# Patient Record
Sex: Male | Born: 1985 | Race: Black or African American | Hispanic: No | Marital: Single | Smoking: Never smoker
Health system: Southern US, Community
[De-identification: ages and names within clinical notes are randomized; demographics above are authoritative.]

---

## 2015-04-10 ENCOUNTER — Encounter (HOSPITAL_COMMUNITY): Payer: Self-pay | Admitting: Emergency Medicine

## 2015-04-10 ENCOUNTER — Emergency Department (HOSPITAL_COMMUNITY)
Admission: EM | Admit: 2015-04-10 | Discharge: 2015-04-10 | Disposition: A | Discharge status: Home / Self Care | Payer: Self-pay | Attending: Emergency Medicine | Admitting: Emergency Medicine

## 2015-04-10 DIAGNOSIS — X58XXXA Exposure to other specified factors, initial encounter: Secondary | ICD-10-CM | POA: Insufficient documentation

## 2015-04-10 DIAGNOSIS — Y9389 Activity, other specified: Secondary | ICD-10-CM | POA: Insufficient documentation

## 2015-04-10 DIAGNOSIS — Y990 Civilian activity done for income or pay: Secondary | ICD-10-CM | POA: Insufficient documentation

## 2015-04-10 DIAGNOSIS — Y9289 Other specified places as the place of occurrence of the external cause: Secondary | ICD-10-CM | POA: Insufficient documentation

## 2015-04-10 DIAGNOSIS — S39012A Strain of muscle, fascia and tendon of lower back, initial encounter: Secondary | ICD-10-CM | POA: Insufficient documentation

## 2015-04-10 MED ORDER — HYDROCODONE-ACETAMINOPHEN 5-325 MG PO TABS: 2.0000 | ORAL_TABLET | ORAL | Status: DC | PRN

## 2015-04-10 MED ORDER — NAPROXEN 375 MG PO TABS: 375.0000 mg | ORAL_TABLET | Freq: Two times a day (BID) | ORAL | Status: DC

## 2015-04-10 MED ORDER — LIDOCAINE 5 % EX PTCH: 1.0000 | MEDICATED_PATCH | CUTANEOUS | Status: DC

## 2015-04-10 NOTE — ED Notes (Signed)
Patient states he was picking up a wooden pallet at work yesterday and he felt like "something popped". Pain in lower medial back, bilateral thighs, and pelvis.

## 2015-04-10 NOTE — ED Provider Notes (Signed)
CSN: [id number]     Arrival date & time 04/10/15  1024 History  This chart was scribed for Arthor Captain, PA-C, working with Lyndal Pulley, MD by Elon Spanner, ED Scribe. This patient was seen in room TR07C/TR07C and the patient's care was started at 11:41 AM.   Chief Complaint  Patient presents with  . Back Pain   The history is provided by the patient. No language interpreter was used.    HPI Comments: Terry Hurst is a 29 y.o. male who presents to the Emergency Department complaining of sudden onset, constant, moderate lower back pain radiating to the buttocks and anterior bilateral thighs onset last night, not relieved by ibuprofen.  The patient reports the pain onset after lifting a heavy pallet at work.  Patient able to ambulate.  He denies radiation down the back of legs, numbness/tingling in feet/calves, saddle anesthesia, loss of bowel/bladder control.    History reviewed. No pertinent past medical history. History reviewed. No pertinent past surgical history. No family history on file. Social History  Substance Use Topics  . Smoking status: Never Smoker   . Smokeless tobacco: None  . Alcohol Use: Yes     Comment: occasionally    Review of Systems  Musculoskeletal: Positive for back pain.  All other systems reviewed and are negative.     Allergies  Sulfa antibiotics  Home Medications   Prior to Admission medications   Not on File   BP 125/67 mmHg  Pulse 69  Temp(Src) 98.6 F (37 C) (Oral)  Resp 20  Ht  (1.778 m)  Wt 212 lb (96.163 kg)  BMI 30.42 kg/m2  SpO2 100% Physical Exam  Constitutional: He is oriented to person, place, and time. He appears well-developed and well-nourished. No distress.  HENT:  Head: Normocephalic and atraumatic.  Eyes: Conjunctivae and EOM are normal.  Neck: Neck supple. No tracheal deviation present.  Cardiovascular: Normal rate.   Pulmonary/Chest: Effort normal. No respiratory distress.  Musculoskeletal: Normal range of  motion.  Tender right over sacral area.    Neurological: He is alert and oriented to person, place, and time.  Skin: Skin is warm and dry.  Psychiatric: He has a normal mood and affect. His behavior is normal.  Nursing note and vitals reviewed.   ED Course  Procedures (including critical care time)  DIAGNOSTIC STUDIES: Oxygen Saturation is 100% on RA, normal by my interpretation.    COORDINATION OF CARE:  11:47 AM Will prescribe muscle relaxant, pain medication, anti-inflammatory, and lido-derm patch.  Patient should ice complaint.  Patient acknowledges and agrees with plan.    Labs Review Labs Reviewed - No data to display  Imaging Review No results found. I have personally reviewed and evaluated these images and lab results as part of my medical decision-making.   EKG Interpretation None      MDM   Final diagnoses:  None    Patient with back pain.  No neurological deficits and normal neuro exam.  Patient can walk but states is painful.  No loss of bowel or bladder control.  No concern for cauda equina.  No fever, night sweats, weight loss, h/o cancer, IVDU.  RICE protocol and pain medicine indicated and discussed with patient.   I personally performed the services described in this documentation, which was scribed in my presence. The recorded information has been reviewed and is accurate.     Arthor Captain, PA-C 04/10/15 1201  Lyndal Pulley, MD 04/11/15 [phone]

## 2015-04-10 NOTE — Discharge Instructions (Signed)
SEEK IMMEDIATE MEDICAL ATTENTION IF: New numbness, tingling, weakness, or problem with the use of your arms or legs.  Severe back pain not relieved with medications.  Change in bowel or bladder control.  Increasing pain in any areas of the body (such as chest or abdominal pain).  Shortness of breath, dizziness or fainting.  Nausea (feeling sick to your stomach), vomiting, fever, or sweats.   Lumbosacral Strain Lumbosacral strain is a strain of any of the parts that make up your lumbosacral vertebrae. Your lumbosacral vertebrae are the bones that make up the lower third of your backbone. Your lumbosacral vertebrae are held together by muscles and tough, fibrous tissue (ligaments).  CAUSES  A sudden blow to your back can cause lumbosacral strain. Also, anything that causes an excessive stretch of the muscles in the low back can cause this strain. This is typically seen when people exert themselves strenuously, fall, lift heavy objects, bend, or crouch repeatedly. RISK FACTORS  Physically demanding work.  Participation in pushing or pulling sports or sports that require a sudden twist of the back (tennis, golf, baseball).  Weight lifting.  Excessive lower back curvature.  Forward-tilted pelvis.  Weak back or abdominal muscles or both.  Tight hamstrings. SIGNS AND SYMPTOMS  Lumbosacral strain may cause pain in the area of your injury or pain that moves (radiates) down your leg.  DIAGNOSIS Your health care provider can often diagnose lumbosacral strain through a physical exam. In some cases, you may need tests such as X-ray exams.  TREATMENT  Treatment for your lower back injury depends on many factors that your clinician will have to evaluate. However, most treatment will include the use of anti-inflammatory medicines. HOME CARE INSTRUCTIONS   Avoid hard physical activities (tennis, racquetball, waterskiing) if you are not in proper physical condition for it. This may aggravate or  create problems.  If you have a back problem, avoid sports requiring sudden body movements. Swimming and walking are generally safer activities.  Maintain good posture.  Maintain a healthy weight.  For acute conditions, you may put ice on the injured area.  Put ice in a plastic bag.  Place a towel between your skin and the bag.  Leave the ice on for 20 minutes, 2-3 times a day.  When the low back starts healing, stretching and strengthening exercises may be recommended. SEEK MEDICAL CARE IF:  Your back pain is getting worse.  You experience severe back pain not relieved with medicines. SEEK IMMEDIATE MEDICAL CARE IF:   You have numbness, tingling, weakness, or problems with the use of your arms or legs.  There is a change in bowel or bladder control.  You have increasing pain in any area of the body, including your belly (abdomen).  You notice shortness of breath, dizziness, or feel faint.  You feel sick to your stomach (nauseous), are throwing up (vomiting), or become sweaty.  You notice discoloration of your toes or legs, or your feet get very cold. MAKE SURE YOU:   Understand these instructions.  Will watch your condition.  Will get help right away if you are not doing well or get worse. Document Released: 04/14/2005 Document Revised: 07/10/2013 Document Reviewed: 02/21/2013 [facility] Patient Information 2015 [place], [place]. This information is not intended to replace advice given to you by your health care provider. Make sure you discuss any questions you have with your health care provider.  Low Back Strain with Rehab A strain is an injury in which a tendon or muscle  is torn. The muscles and tendons of the lower back are vulnerable to strains. However, these muscles and tendons are very strong and require a great force to be injured. Strains are classified into three categories. Grade 1 strains cause pain, but the tendon is not lengthened. Grade 2 strains include a  lengthened ligament, due to the ligament being stretched or partially ruptured. With grade 2 strains there is still function, although the function may be decreased. Grade 3 strains involve a complete tear of the tendon or muscle, and function is usually impaired. SYMPTOMS   Pain in the lower back.  Pain that affects one side more than the other.  Pain that gets worse with movement and may be felt in the hip, buttocks, or back of the thigh.  Muscle spasms of the muscles in the back.  Swelling along the muscles of the back.  Loss of strength of the back muscles.  Crackling sound (crepitation) when the muscles are touched. CAUSES  Lower back strains occur when a force is placed on the muscles or tendons that is greater than they can handle. Common causes of injury include:  Prolonged overuse of the muscle-tendon units in the lower back, usually from incorrect posture.  A single violent injury or force applied to the back. RISK INCREASES WITH:  Sports that involve twisting forces on the spine or a lot of bending at the waist (football, rugby, weightlifting, bowling, golf, tennis, speed skating, racquetball, swimming, running, gymnastics, diving).  Poor strength and flexibility.  Failure to warm up properly before activity.  Family history of lower back pain or disk disorders.  Previous back injury or surgery (especially fusion).  Poor posture with lifting, especially heavy objects.  Prolonged sitting, especially with poor posture. PREVENTION   Learn and use proper posture when sitting or lifting (maintain proper posture when sitting, lift using the knees and legs, not at the waist).  Warm up and stretch properly before activity.  Allow for adequate recovery between workouts.  Maintain physical fitness:  Strength, flexibility, and endurance.  Cardiovascular fitness. PROGNOSIS  If treated properly, lower back strains usually heal within 6 weeks. RELATED COMPLICATIONS     Recurring symptoms, resulting in a chronic problem.  Chronic inflammation, scarring, and partial muscle-tendon tear.  Delayed healing or resolution of symptoms.  Prolonged disability. TREATMENT  Treatment first involves the use of ice and medicine, to reduce pain and inflammation. The use of strengthening and stretching exercises may help reduce pain with activity. These exercises may be performed at home or with a therapist. Severe injuries may require referral to a therapist for further evaluation and treatment, such as ultrasound. Your caregiver may advise that you wear a back brace or corset, to help reduce pain and discomfort. Often, prolonged bed rest results in greater harm then benefit. Corticosteroid injections may be recommended. However, these should be reserved for the most serious cases. It is important to avoid using your back when lifting objects. At night, sleep on your back on a firm mattress with a pillow placed under your knees. If non-surgical treatment is unsuccessful, surgery may be needed.  MEDICATION   If pain medicine is needed, nonsteroidal anti-inflammatory medicines (aspirin and ibuprofen), or other minor pain relievers (acetaminophen), are often advised.  Do not take pain medicine for 7 days before surgery.  Prescription pain relievers may be given, if your caregiver thinks they are needed. Use only as directed and only as much as you need.  Ointments applied to the skin  may be helpful.  Corticosteroid injections may be given by your caregiver. These injections should be reserved for the most serious cases, because they may only be given a certain number of times. HEAT AND COLD  Cold treatment (icing) should be applied for 10 to 15 minutes every 2 to 3 hours for inflammation and pain, and immediately after activity that aggravates your symptoms. Use ice packs or an ice massage.  Heat treatment may be used before performing stretching and strengthening  activities prescribed by your caregiver, physical therapist, or athletic trainer. Use a heat pack or a warm water soak. SEEK MEDICAL CARE IF:   Symptoms get worse or do not improve in 2 to 4 weeks, despite treatment.  You develop numbness, weakness, or loss of bowel or bladder function.  New, unexplained symptoms develop. (Drugs used in treatment may produce side effects.) EXERCISES  RANGE OF MOTION (ROM) AND STRETCHING EXERCISES - Low Back Strain Most people with lower back pain will find that their symptoms get worse with excessive bending forward (flexion) or arching at the lower back (extension). The exercises which will help resolve your symptoms will focus on the opposite motion.  Your physician, physical therapist or athletic trainer will help you determine which exercises will be most helpful to resolve your lower back pain. Do not complete any exercises without first consulting with your caregiver. Discontinue any exercises which make your symptoms worse until you speak to your caregiver.  If you have pain, numbness or tingling which travels down into your buttocks, leg or foot, the goal of the therapy is for these symptoms to move closer to your back and eventually resolve. Sometimes, these leg symptoms will get better, but your lower back pain may worsen. This is typically an indication of progress in your rehabilitation. Be very alert to any changes in your symptoms and the activities in which you participated in the 24 hours prior to the change. Sharing this information with your caregiver will allow him/her to most efficiently treat your condition.  These exercises may help you when beginning to rehabilitate your injury. Your symptoms may resolve with or without further involvement from your physician, physical therapist or athletic trainer. While completing these exercises, remember:  Restoring tissue flexibility helps normal motion to return to the joints. This allows healthier, less  painful movement and activity.  An effective stretch should be held for at least 30 seconds.  A stretch should never be painful. You should only feel a gentle lengthening or release in the stretched tissue. FLEXION RANGE OF MOTION AND STRETCHING EXERCISES: STRETCH - Flexion, Single Knee to Chest   Lie on a firm bed or floor with both legs extended in front of you.  Keeping one leg in contact with the floor, bring your opposite knee to your chest. Hold your leg in place by either grabbing behind your thigh or at your knee.  Pull until you feel a gentle stretch in your lower back. Hold __________ seconds.  Slowly release your grasp and repeat the exercise with the opposite side. Repeat __________ times. Complete this exercise __________ times per day.  STRETCH - Flexion, Double Knee to Chest   Lie on a firm bed or floor with both legs extended in front of you.  Keeping one leg in contact with the floor, bring your opposite knee to your chest.  Tense your stomach muscles to support your back and then lift your other knee to your chest. Hold your legs in place by either  grabbing behind your thighs or at your knees.  Pull both knees toward your chest until you feel a gentle stretch in your lower back. Hold __________ seconds.  Tense your stomach muscles and slowly return one leg at a time to the floor. Repeat __________ times. Complete this exercise __________ times per day.  STRETCH - Low Trunk Rotation  Lie on a firm bed or floor. Keeping your legs in front of you, bend your knees so they are both pointed toward the ceiling and your feet are flat on the floor.  Extend your arms out to the side. This will stabilize your upper body by keeping your shoulders in contact with the floor.  Gently and slowly drop both knees together to one side until you feel a gentle stretch in your lower back. Hold for __________ seconds.  Tense your stomach muscles to support your lower back as you bring  your knees back to the starting position. Repeat the exercise to the other side. Repeat __________ times. Complete this exercise __________ times per day  EXTENSION RANGE OF MOTION AND FLEXIBILITY EXERCISES: STRETCH - Extension, Prone on Elbows   Lie on your stomach on the floor, a bed will be too soft. Place your palms about shoulder width apart and at the height of your head.  Place your elbows under your shoulders. If this is too painful, stack pillows under your chest.  Allow your body to relax so that your hips drop lower and make contact more completely with the floor.  Hold this position for __________ seconds.  Slowly return to lying flat on the floor. Repeat __________ times. Complete this exercise __________ times per day.  RANGE OF MOTION - Extension, Prone Press Ups  Lie on your stomach on the floor, a bed will be too soft. Place your palms about shoulder width apart and at the height of your head.  Keeping your back as relaxed as possible, slowly straighten your elbows while keeping your hips on the floor. You may adjust the placement of your hands to maximize your comfort. As you gain motion, your hands will come more underneath your shoulders.  Hold this position __________ seconds.  Slowly return to lying flat on the floor. Repeat __________ times. Complete this exercise __________ times per day.  RANGE OF MOTION- Quadruped, Neutral Spine   Assume a hands and knees position on a firm surface. Keep your hands under your shoulders and your knees under your hips. You may place padding under your knees for comfort.  Drop your head and point your tail bone toward the ground below you. This will round out your lower back like an angry cat. Hold this position for __________ seconds.  Slowly lift your head and release your tail bone so that your back sags into a large arch, like an old horse.  Hold this position for __________ seconds.  Repeat this until you feel limber in  your lower back.  Now, find your "sweet spot." This will be the most comfortable position somewhere between the two previous positions. This is your neutral spine. Once you have found this position, tense your stomach muscles to support your lower back.  Hold this position for __________ seconds. Repeat __________ times. Complete this exercise __________ times per day.  STRENGTHENING EXERCISES - Low Back Strain These exercises may help you when beginning to rehabilitate your injury. These exercises should be done near your "sweet spot." This is the neutral, low-back arch, somewhere between fully rounded and fully arched,  that is your least painful position. When performed in this safe range of motion, these exercises can be used for people who have either a flexion or extension based injury. These exercises may resolve your symptoms with or without further involvement from your physician, physical therapist or athletic trainer. While completing these exercises, remember:   Muscles can gain both the endurance and the strength needed for everyday activities through controlled exercises.  Complete these exercises as instructed by your physician, physical therapist or athletic trainer. Increase the resistance and repetitions only as guided.  You may experience muscle soreness or fatigue, but the pain or discomfort you are trying to eliminate should never worsen during these exercises. If this pain does worsen, stop and make certain you are following the directions exactly. If the pain is still present after adjustments, discontinue the exercise until you can discuss the trouble with your caregiver. STRENGTHENING - Deep Abdominals, Pelvic Tilt  Lie on a firm bed or floor. Keeping your legs in front of you, bend your knees so they are both pointed toward the ceiling and your feet are flat on the floor.  Tense your lower abdominal muscles to press your lower back into the floor. This motion will rotate  your pelvis so that your tail bone is scooping upwards rather than pointing at your feet or into the floor.  With a gentle tension and even breathing, hold this position for __________ seconds. Repeat __________ times. Complete this exercise __________ times per day.  STRENGTHENING - Abdominals, Crunches   Lie on a firm bed or floor. Keeping your legs in front of you, bend your knees so they are both pointed toward the ceiling and your feet are flat on the floor. Cross your arms over your chest.  Slightly tip your chin down without bending your neck.  Tense your abdominals and slowly lift your trunk high enough to just clear your shoulder blades. Lifting higher can put excessive stress on the lower back and does not further strengthen your abdominal muscles.  Control your return to the starting position. Repeat __________ times. Complete this exercise __________ times per day.  STRENGTHENING - Quadruped, Opposite UE/LE Lift   Assume a hands and knees position on a firm surface. Keep your hands under your shoulders and your knees under your hips. You may place padding under your knees for comfort.  Find your neutral spine and gently tense your abdominal muscles so that you can maintain this position. Your shoulders and hips should form a rectangle that is parallel with the floor and is not twisted.  Keeping your trunk steady, lift your right hand no higher than your shoulder and then your left leg no higher than your hip. Make sure you are not holding your breath. Hold this position __________ seconds.  Continuing to keep your abdominal muscles tense and your back steady, slowly return to your starting position. Repeat with the opposite arm and leg. Repeat __________ times. Complete this exercise __________ times per day.  STRENGTHENING - Lower Abdominals, Double Knee Lift  Lie on a firm bed or floor. Keeping your legs in front of you, bend your knees so they are both pointed toward the  ceiling and your feet are flat on the floor.  Tense your abdominal muscles to brace your lower back and slowly lift both of your knees until they come over your hips. Be certain not to hold your breath.  Hold __________ seconds. Using your abdominal muscles, return to the starting position in  a slow and controlled manner. Repeat __________ times. Complete this exercise __________ times per day.  POSTURE AND BODY MECHANICS CONSIDERATIONS - Low Back Strain Keeping correct posture when sitting, standing or completing your activities will reduce the stress put on different body tissues, allowing injured tissues a chance to heal and limiting painful experiences. The following are general guidelines for improved posture. Your physician or physical therapist will provide you with any instructions specific to your needs. While reading these guidelines, remember:  The exercises prescribed by your provider will help you have the flexibility and strength to maintain correct postures.  The correct posture provides the best environment for your joints to work. All of your joints have less wear and tear when properly supported by a spine with good posture. This means you will experience a healthier, less painful body.  Correct posture must be practiced with all of your activities, especially prolonged sitting and standing. Correct posture is as important when doing repetitive low-stress activities (typing) as it is when doing a single heavy-load activity (lifting). RESTING POSITIONS Consider which positions are most painful for you when choosing a resting position. If you have pain with flexion-based activities (sitting, bending, stooping, squatting), choose a position that allows you to rest in a less flexed posture. You would want to avoid curling into a fetal position on your side. If your pain worsens with extension-based activities (prolonged standing, working overhead), avoid resting in an extended position  such as sleeping on your stomach. Most people will find more comfort when they rest with their spine in a more neutral position, neither too rounded nor too arched. Lying on a non-sagging bed on your side with a pillow between your knees, or on your back with a pillow under your knees will often provide some relief. Keep in mind, being in any one position for a prolonged period of time, no matter how correct your posture, can still lead to stiffness. PROPER SITTING POSTURE In order to minimize stress and discomfort on your spine, you must sit with correct posture. Sitting with good posture should be effortless for a healthy body. Returning to good posture is a gradual process. Many people can work toward this most comfortably by using various supports until they have the flexibility and strength to maintain this posture on their own. When sitting with proper posture, your ears will fall over your shoulders and your shoulders will fall over your hips. You should use the back of the chair to support your upper back. Your lower back will be in a neutral position, just slightly arched. You may place a small pillow or folded towel at the base of your lower back for support.  When working at a desk, create an environment that supports good, upright posture. Without extra support, muscles tire, which leads to excessive strain on joints and other tissues. Keep these recommendations in mind: CHAIR:  A chair should be able to slide under your desk when your back makes contact with the back of the chair. This allows you to work closely.  The chair's height should allow your eyes to be level with the upper part of your monitor and your hands to be slightly lower than your elbows. BODY POSITION  Your feet should make contact with the floor. If this is not possible, use a foot rest.  Keep your ears over your shoulders. This will reduce stress on your neck and lower back. INCORRECT SITTING POSTURES  If you are  feeling tired and unable  to assume a healthy sitting posture, do not slouch or slump. This puts excessive strain on your back tissues, causing more damage and pain. Healthier options include:  Using more support, like a lumbar pillow.  Switching tasks to something that requires you to be upright or walking.  Talking a brief walk.  Lying down to rest in a neutral-spine position. PROLONGED STANDING WHILE SLIGHTLY LEANING FORWARD  When completing a task that requires you to lean forward while standing in one place for a long time, place either foot up on a stationary 2-4 inch high object to help maintain the best posture. When both feet are on the ground, the lower back tends to lose its slight inward curve. If this curve flattens (or becomes too large), then the back and your other joints will experience too much stress, tire more quickly, and can cause pain. CORRECT STANDING POSTURES Proper standing posture should be assumed with all daily activities, even if they only take a few moments, like when brushing your teeth. As in sitting, your ears should fall over your shoulders and your shoulders should fall over your hips. You should keep a slight tension in your abdominal muscles to brace your spine. Your tailbone should point down to the ground, not behind your body, resulting in an over-extended swayback posture.  INCORRECT STANDING POSTURES  Common incorrect standing postures include a forward head, locked knees and/or an excessive swayback. WALKING Walk with an upright posture. Your ears, shoulders and hips should all line-up. PROLONGED ACTIVITY IN A FLEXED POSITION When completing a task that requires you to bend forward at your waist or lean over a low surface, try to find a way to stabilize 3 out of 4 of your limbs. You can place a hand or elbow on your thigh or rest a knee on the surface you are reaching across. This will provide you more stability so that your muscles do not fatigue as  quickly. By keeping your knees relaxed, or slightly bent, you will also reduce stress across your lower back. CORRECT LIFTING TECHNIQUES DO :   Assume a wide stance. This will provide you more stability and the opportunity to get as close as possible to the object which you are lifting.  Tense your abdominals to brace your spine. Bend at the knees and hips. Keeping your back locked in a neutral-spine position, lift using your leg muscles. Lift with your legs, keeping your back straight.  Test the weight of unknown objects before attempting to lift them.  Try to keep your elbows locked down at your sides in order get the best strength from your shoulders when carrying an object.  Always ask for help when lifting heavy or awkward objects. INCORRECT LIFTING TECHNIQUES DO NOT:   Lock your knees when lifting, even if it is a small object.  Bend and twist. Pivot at your feet or move your feet when needing to change directions.  Assume that you can safely pick up even a paper clip without proper posture. Document Released: 07/05/2005 Document Revised: 09/27/2011 Document Reviewed: 10/17/2008 [facility] Patient Information 2015 [place], [place]. This information is not intended to replace advice given to you by your health care provider. Make sure you discuss any questions you have with your health care provider.

## 2015-04-10 NOTE — ED Notes (Signed)
Pt c/o LBP since lifting a wooden pallet at work last evening. States pain radiates to his buttocks, pelvis and legs.

## 2016-08-26 ENCOUNTER — Emergency Department (HOSPITAL_COMMUNITY)
Admission: EM | Admit: 2016-08-26 | Discharge: 2016-08-26 | Disposition: A | Discharge status: Home / Self Care | Payer: Self-pay | Attending: Dermatology | Admitting: Dermatology

## 2016-08-26 ENCOUNTER — Encounter (HOSPITAL_COMMUNITY): Payer: Self-pay

## 2016-08-26 DIAGNOSIS — Z5321 Procedure and treatment not carried out due to patient leaving prior to being seen by health care provider: Secondary | ICD-10-CM | POA: Insufficient documentation

## 2016-08-26 DIAGNOSIS — R05 Cough: Secondary | ICD-10-CM | POA: Insufficient documentation

## 2016-08-26 NOTE — ED Notes (Signed)
Pt made aware that he had fever.  Speaking to triage nurse about meds

## 2016-08-26 NOTE — ED Triage Notes (Signed)
Pt complaining of cough and congestion x 1 day. Pt states fever/chills. Pt also requesting STD testing.

## 2016-08-26 NOTE — ED Notes (Signed)
Stickers provided by registration.  States the patient left.  Did not speak to RN.

## 2016-08-28 ENCOUNTER — Emergency Department (HOSPITAL_COMMUNITY)
Admission: EM | Admit: 2016-08-28 | Discharge: 2016-08-28 | Disposition: A | Discharge status: Home / Self Care | Payer: Managed Care, Other (non HMO) | Attending: Emergency Medicine | Admitting: Emergency Medicine

## 2016-08-28 ENCOUNTER — Encounter (HOSPITAL_COMMUNITY): Payer: Self-pay

## 2016-08-28 DIAGNOSIS — Z202 Contact with and (suspected) exposure to infections with a predominantly sexual mode of transmission: Secondary | ICD-10-CM | POA: Diagnosis present

## 2016-08-28 DIAGNOSIS — Z711 Person with feared health complaint in whom no diagnosis is made: Secondary | ICD-10-CM

## 2016-08-28 LAB — RAPID HIV SCREEN (HIV 1/2 AB+AG)
HIV 1/2 Antibodies: NONREACTIVE
HIV-1 P24 Antigen - HIV24: NONREACTIVE

## 2016-08-28 NOTE — ED Triage Notes (Signed)
PT denies any contact with partners who are positive for a STD. Pt denies any symptoms . Pt reports he just wants a well STD check up.

## 2016-08-28 NOTE — ED Provider Notes (Signed)
[facility]-EMERGENCY DEPT Provider Note   CSN: [id number] Arrival date & time: 08/28/16  [id number]  By signing my name below, I, Terry Hurst, attest that this documentation has been prepared under the direction and in the presence of Fayrene HelperBowie Xiadani Damman, PA-C. Electronically Signed: Orpah CobbMaurice Hurst , ED Scribe. 08/28/16. 11:27 AM.    History   Chief Complaint No chief complaint on file.   HPI  Terry Hurst is a 31 y.o. male with no hx of STD who presents to the Emergency Department complaining of a male GU problem with onset x2 month. Pt suspects that he has a bacterial infection due to discoloration of his pubic hairs x2 months ago. He states that he has tried trimming the pubic hair but it grows back with the same presentation. Pt denies testicular pain, itchiness, dysuria, hematuria, penile discharge. Of note, pt reports having unprotected sex with one partner in the past year.   The history is provided by the patient. No language interpreter was used.    History reviewed. No pertinent past medical history.  There are no active problems to display for this patient.   History reviewed. No pertinent surgical history.     Home Medications    Prior to Admission medications   Medication Sig Start Date End Date Taking? Authorizing Provider  HYDROcodone-acetaminophen (NORCO) 5-325 MG per tablet Take 2 tablets by mouth every 4 (four) hours as needed. Patient not taking: Reported on 08/28/2016 04/10/15   Arthor CaptainAbigail Harris, PA-C  lidocaine (LIDODERM) 5 % Place 1 patch onto the skin daily. Remove & Discard patch within 12 hours or as directed by MD Patient not taking: Reported on 08/28/2016 04/10/15   Arthor CaptainAbigail Harris, PA-C  naproxen (NAPROSYN) 375 MG tablet Take 1 tablet (375 mg total) by mouth 2 (two) times daily. Patient not taking: Reported on 08/28/2016 04/10/15   Arthor CaptainAbigail Harris, PA-C    Family History No family history on file.  Social History Social History  Substance Use Topics  . Smoking  status: Never Smoker  . Smokeless tobacco: Never Used  . Alcohol use Yes     Comment: occasionally     Allergies   Sulfa antibiotics   Review of Systems Review of Systems  Constitutional: Negative for fever.  Respiratory: Negative for shortness of breath.   Cardiovascular: Negative for chest pain.  Genitourinary: Negative for discharge, dysuria, penile pain and testicular pain.     Physical Exam Updated Vital Signs BP 120/80   Pulse 78   Temp 98.8 F (37.1 C) (Oral)   Resp 18   SpO2 100%   Physical Exam  Constitutional: He appears well-developed and well-nourished.  HENT:  Head: Normocephalic and atraumatic.  Eyes: Conjunctivae are normal.  Neck: Neck supple.  Cardiovascular: Normal rate and regular rhythm.   No murmur heard. Pulmonary/Chest: Effort normal and breath sounds normal. No respiratory distress.  Abdominal: Soft. There is no tenderness.  Genitourinary: Testes normal. Right testis shows no tenderness. Left testis shows no tenderness. Circumcised. No penile tenderness.  Genitourinary Comments: Chaperone present: Terry Hurst Circumcised penis free of lesional rash, pubic hair on the R side has whitish coating on the hair. Testes with normal lie.   Musculoskeletal: He exhibits no edema.  Neurological: He is alert.  Skin: Skin is warm and dry.  Psychiatric: He has a normal mood and affect.  Nursing note and vitals reviewed.    ED Treatments / Results   DIAGNOSTIC STUDIES: Oxygen Saturation is 100% on RA, normal by my interpretation.  COORDINATION OF CARE: 11:28 AM-Discussed next steps with pt. Pt verbalized understanding and is agreeable with the plan.    Labs (all labs ordered are listed, but only abnormal results are displayed) Labs Reviewed - No data to display  EKG  EKG Interpretation None       Radiology No results found.  Procedures Procedures (including critical care time)  Medications Ordered in ED Medications - No  data to display   Initial Impression / Assessment and Plan / ED Course  I have reviewed the triage vital signs and the nursing notes.  Pertinent labs & imaging results that were available during my care of the patient were reviewed by me and considered in my medical decision making (see chart for details).     BP 120/80   Pulse 78   Temp 98.8 F (37.1 C) (Oral)   Resp 18   SpO2 100%    Final Clinical Impressions(s) / ED Diagnoses   Final diagnoses:  Concern about STD in male without diagnosis    New Prescriptions New Prescriptions   No medications on file   I personally performed the services described in this documentation, which was scribed in my presence. The recorded information has been reviewed and is accurate.   12:32 PM Pt here with concern for STD.  sts he noticed discoloration to his pubic hair for the past 2 months.  On exam, it appears he has some off white coating overlying some of his pubic hair, which can be rubbed off.  Unsure if it's from excessive sweating which trapped dead skin cells vs discharge. It doesn't appear that the hair has lost any pigmentation.  STD screen provided.  Recommend f/u with pcp.  He also have significant gynecomastia, has had it for most of his life.  Not on any long term medication that may have cause it.  He should f/u with pcp for this.     Fayrene Helper, PA-C 08/28/16 1234    Mancel Bale, MD 08/28/16 [phone]

## 2016-08-28 NOTE — ED Triage Notes (Signed)
Patient here with complaint of discoloration to pubic hair for a few months and had cramping in left calf yesterday that has resolved

## 2016-08-28 NOTE — ED Notes (Signed)
Declined W/C at D/C and was escorted to lobby by RN. 

## 2016-08-28 NOTE — Discharge Instructions (Signed)
You have been screen for potential sexually transmitted disease.  You will be notified in the next few days if tested positive.  You also have evidence of gynecomastia, a condition of enlarge breast tissue in male.  Please find a primary care provider for further evaluation and management.

## 2016-08-29 LAB — RPR: RPR: NONREACTIVE

## 2016-08-30 LAB — GC/CHLAMYDIA PROBE AMP (~~LOC~~) NOT AT ARMC
Chlamydia: NEGATIVE
NEISSERIA GONORRHEA: NEGATIVE

## 2017-05-23 ENCOUNTER — Encounter (HOSPITAL_COMMUNITY): Payer: Self-pay | Admitting: Emergency Medicine

## 2017-05-23 ENCOUNTER — Other Ambulatory Visit: Payer: Self-pay

## 2017-05-23 ENCOUNTER — Emergency Department (HOSPITAL_COMMUNITY)
Admission: EM | Admit: 2017-05-23 | Discharge: 2017-05-23 | Disposition: A | Discharge status: Home / Self Care | Payer: Managed Care, Other (non HMO) | Attending: Emergency Medicine | Admitting: Emergency Medicine

## 2017-05-23 DIAGNOSIS — S161XXA Strain of muscle, fascia and tendon at neck level, initial encounter: Secondary | ICD-10-CM | POA: Insufficient documentation

## 2017-05-23 DIAGNOSIS — Y92410 Unspecified street and highway as the place of occurrence of the external cause: Secondary | ICD-10-CM | POA: Diagnosis not present

## 2017-05-23 DIAGNOSIS — M7918 Myalgia, other site: Secondary | ICD-10-CM

## 2017-05-23 DIAGNOSIS — Y9389 Activity, other specified: Secondary | ICD-10-CM | POA: Insufficient documentation

## 2017-05-23 DIAGNOSIS — Y999 Unspecified external cause status: Secondary | ICD-10-CM | POA: Insufficient documentation

## 2017-05-23 DIAGNOSIS — S199XXA Unspecified injury of neck, initial encounter: Secondary | ICD-10-CM | POA: Diagnosis present

## 2017-05-23 MED ORDER — IBUPROFEN 200 MG PO TABS
600.0000 mg | ORAL_TABLET | Freq: Once | ORAL | Status: AC
  Administered 2017-05-23: 600 mg via ORAL
  Filled 2017-05-23: qty 1

## 2017-05-23 MED ORDER — IBUPROFEN 600 MG PO TABS: 600.0000 mg | ORAL_TABLET | Freq: Four times a day (QID) | ORAL | 0 refills | Status: DC | PRN

## 2017-05-23 MED ORDER — METHOCARBAMOL 500 MG PO TABS: 500.0000 mg | ORAL_TABLET | Freq: Two times a day (BID) | ORAL | 0 refills | Status: DC
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
## 2017-05-25 ENCOUNTER — Encounter (HOSPITAL_COMMUNITY): Payer: Self-pay | Admitting: Emergency Medicine

## 2017-05-25 ENCOUNTER — Emergency Department (HOSPITAL_COMMUNITY)
Admission: EM | Admit: 2017-05-25 | Discharge: 2017-05-25 | Disposition: A | Discharge status: Home / Self Care | Payer: Managed Care, Other (non HMO) | Attending: Emergency Medicine | Admitting: Emergency Medicine

## 2017-05-25 ENCOUNTER — Emergency Department (HOSPITAL_COMMUNITY): Payer: Managed Care, Other (non HMO)

## 2017-05-25 ENCOUNTER — Other Ambulatory Visit: Payer: Self-pay

## 2017-05-25 DIAGNOSIS — Y939 Activity, unspecified: Secondary | ICD-10-CM | POA: Insufficient documentation

## 2017-05-25 DIAGNOSIS — M549 Dorsalgia, unspecified: Secondary | ICD-10-CM

## 2017-05-25 DIAGNOSIS — Y999 Unspecified external cause status: Secondary | ICD-10-CM | POA: Diagnosis not present

## 2017-05-25 DIAGNOSIS — Y92410 Unspecified street and highway as the place of occurrence of the external cause: Secondary | ICD-10-CM | POA: Insufficient documentation

## 2017-05-25 DIAGNOSIS — T148XXA Other injury of unspecified body region, initial encounter: Secondary | ICD-10-CM

## 2017-05-25 DIAGNOSIS — S199XXA Unspecified injury of neck, initial encounter: Secondary | ICD-10-CM | POA: Diagnosis present

## 2017-05-25 DIAGNOSIS — S161XXA Strain of muscle, fascia and tendon at neck level, initial encounter: Secondary | ICD-10-CM | POA: Insufficient documentation
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
## 2018-01-19 ENCOUNTER — Encounter (HOSPITAL_COMMUNITY): Payer: Self-pay | Admitting: Emergency Medicine

## 2018-01-19 ENCOUNTER — Emergency Department (HOSPITAL_COMMUNITY)
Admission: EM | Admit: 2018-01-19 | Discharge: 2018-01-19 | Disposition: A | Discharge status: Home / Self Care | Payer: Managed Care, Other (non HMO) | Attending: Emergency Medicine | Admitting: Emergency Medicine

## 2018-01-19 ENCOUNTER — Emergency Department (HOSPITAL_COMMUNITY): Payer: Managed Care, Other (non HMO)

## 2018-01-19 DIAGNOSIS — Y9389 Activity, other specified: Secondary | ICD-10-CM | POA: Diagnosis not present

## 2018-01-19 DIAGNOSIS — S01111A Laceration without foreign body of right eyelid and periocular area, initial encounter: Secondary | ICD-10-CM | POA: Diagnosis not present

## 2018-01-19 DIAGNOSIS — S0990XA Unspecified injury of head, initial encounter: Secondary | ICD-10-CM | POA: Diagnosis present

## 2018-01-19 DIAGNOSIS — Y999 Unspecified external cause status: Secondary | ICD-10-CM | POA: Insufficient documentation

## 2018-01-19 DIAGNOSIS — E860 Dehydration: Secondary | ICD-10-CM | POA: Diagnosis not present

## 2018-01-19 DIAGNOSIS — W0110XA Fall on same level from slipping, tripping and stumbling with subsequent striking against unspecified object, initial encounter: Secondary | ICD-10-CM | POA: Diagnosis not present

## 2018-01-19 DIAGNOSIS — Y929 Unspecified place or not applicable: Secondary | ICD-10-CM | POA: Diagnosis not present

## 2018-01-19 DIAGNOSIS — R55 Syncope and collapse: Secondary | ICD-10-CM | POA: Insufficient documentation

## 2018-01-19 LAB — CBC WITH DIFFERENTIAL/PLATELET
Abs Immature Granulocytes: 0.1 10*3/uL (ref 0.0–0.1)
BASOS ABS: 0 10*3/uL (ref 0.0–0.1)
BASOS PCT: 0 %
EOS PCT: 0 %
Eosinophils Absolute: 0 10*3/uL (ref 0.0–0.7)
HCT: 45.2 % (ref 39.0–52.0)
Hemoglobin: 14.1 g/dL (ref 13.0–17.0)
IMMATURE GRANULOCYTES: 0 %
LYMPHS PCT: 10 %
Lymphs Abs: 1.2 10*3/uL (ref 0.7–4.0)
MCH: 27.4 pg (ref 26.0–34.0)
MCHC: 31.2 g/dL (ref 30.0–36.0)
MCV: 87.9 fL (ref 78.0–100.0)
Monocytes Absolute: 0.7 10*3/uL (ref 0.1–1.0)
Monocytes Relative: 6 %
NEUTROS ABS: 9.9 10*3/uL — AB (ref 1.7–7.7)
Neutrophils Relative %: 84 %
PLATELETS: 202 10*3/uL (ref 150–400)
RBC: 5.14 MIL/uL (ref 4.22–5.81)
RDW: 13.6 % (ref 11.5–15.5)
WBC: 11.9 10*3/uL — AB (ref 4.0–10.5)

## 2018-01-19 LAB — COMPREHENSIVE METABOLIC PANEL
ALT: 10 U/L (ref 0–44)
ANION GAP: 9 (ref 5–15)
AST: 18 U/L (ref 15–41)
Albumin: 3.2 g/dL — ABNORMAL LOW (ref 3.5–5.0)
Alkaline Phosphatase: 30 U/L — ABNORMAL LOW (ref 38–126)
BILIRUBIN TOTAL: 0.6 mg/dL (ref 0.3–1.2)
BUN: 13 mg/dL (ref 6–20)
CHLORIDE: 108 mmol/L (ref 98–111)
CO2: 22 mmol/L (ref 22–32)
Calcium: 8.3 mg/dL — ABNORMAL LOW (ref 8.9–10.3)
Creatinine, Ser: 1 mg/dL (ref 0.61–1.24)
GFR calc non Af Amer: >60 mL/min (ref >60)
Glucose, Bld: 79 mg/dL (ref 70–99)
POTASSIUM: 3.9 mmol/L (ref 3.5–5.1)
Sodium: 139 mmol/L (ref 135–145)
TOTAL PROTEIN: 5.2 g/dL — AB (ref 6.5–8.1)

## 2018-01-19 LAB — URINALYSIS, ROUTINE W REFLEX MICROSCOPIC
Bilirubin Urine: NEGATIVE
GLUCOSE, UA: NEGATIVE mg/dL
Hgb urine dipstick: NEGATIVE
KETONES UR: 20 mg/dL — AB
LEUKOCYTES UA: NEGATIVE
NITRITE: NEGATIVE
PROTEIN: NEGATIVE mg/dL
Specific Gravity, Urine: 1.017 (ref 1.005–1.030)
pH: 6 (ref 5.0–8.0)

## 2018-01-19 LAB — ETHANOL: Alcohol, Ethyl (B): 19 mg/dL — ABNORMAL HIGH (ref <10)

## 2018-01-19 MED ORDER — LIDOCAINE-EPINEPHRINE (PF) 2 %-1:200000 IJ SOLN
10.0000 mL | Freq: Once | INTRAMUSCULAR | Status: AC
  Administered 2018-01-19: 10 mL
  Filled 2018-01-19: qty 20

## 2018-01-19 MED ORDER — TETANUS-DIPHTH-ACELL PERTUSSIS 5-2.5-18.5 LF-MCG/0.5 IM SUSP
0.5000 mL | Freq: Once | INTRAMUSCULAR | Status: AC
  Administered 2018-01-19: 0.5 mL via INTRAMUSCULAR
  Filled 2018-01-19: qty 0.5

## 2018-01-19 MED ORDER — SODIUM CHLORIDE 0.9 % IV BOLUS
1000.0000 mL | Freq: Once | INTRAVENOUS | Status: AC
  Administered 2018-01-19: 1000 mL via INTRAVENOUS

## 2018-01-19 NOTE — ED Notes (Signed)
Pt informed to provide UA when possible. Pt given urinal / verbalized understanding.

## 2018-01-19 NOTE — ED Notes (Signed)
Patient verbalizes understanding of discharge instructions. Opportunity for questioning and answers were provided. Armband removed by staff, pt discharged from ED in wheelchair.  

## 2018-01-19 NOTE — ED Notes (Signed)
Pt c.o pain at Iv site, no infiltration or phlebitis noted, family member at bedside requesting this rn remove the Iv, this rn asked the pt if it was hurting, pt did not respond due to family member talking over pt. Iv removed and another Iv to be established

## 2018-01-19 NOTE — ED Notes (Signed)
Informed pt we still need urine sample. Urinal at bedside

## 2018-01-19 NOTE — ED Triage Notes (Signed)
Pt here after a syncopal episode after drinking some etoh today and giving plasma , pos loc , small lac to the forehead

## 2018-01-19 NOTE — ED Provider Notes (Signed)
[facility] EMERGENCY DEPARTMENT Provider Note   CSN: [id number] Arrival date & time: 01/19/18  1811     History   Chief Complaint Chief Complaint  Patient presents with  . Loss of Consciousness    HPI Terry Hurst is a 32 y.o. male.  HPI Patient states he gave plasma this morning.  He is been standing outside drinking beer grilling most of the day.  Got up from standing and had a brief syncopal episode falling back and striking the back of his head.  This was witnessed and patient was only syncopal for a brief moment.  When patient came to he immediately jumped up and attempted to walk inside.  This point he collapsed again striking the corner of a door.  Had very brief loss of consciousness.  No seizure-like activity.  Sustained laceration to the right eyebrow.  Bleeding is controlled currently.  Patient states he just feels generally weak.  Complains of headache.  No neck pain.  No focal weakness or numbness. History reviewed. No pertinent past medical history.  There are no active problems to display for this patient.   History reviewed. No pertinent surgical history.      Home Medications    Prior to Admission medications   Medication Sig Start Date End Date Taking? Authorizing Provider  HYDROcodone-acetaminophen (NORCO) 5-325 MG per tablet Take 2 tablets by mouth every 4 (four) hours as needed. Patient not taking: Reported on 08/28/2016 04/10/15   Arthor Captain, PA-C  ibuprofen (ADVIL,MOTRIN) 600 MG tablet Take 1 tablet (600 mg total) every 6 (six) hours as needed by mouth. Patient not taking: Reported on 01/19/2018 05/23/17   Dartha Lodge, PA-C  lidocaine (LIDODERM) 5 % Place 1 patch onto the skin daily. Remove & Discard patch within 12 hours or as directed by MD Patient not taking: Reported on 08/28/2016 04/10/15   Arthor Captain, PA-C  methocarbamol (ROBAXIN) 500 MG tablet Take 1 tablet (500 mg total) 2 (two) times daily by mouth. Patient not taking:  Reported on 01/19/2018 05/23/17   Dartha Lodge, PA-C  naproxen (NAPROSYN) 375 MG tablet Take 1 tablet (375 mg total) by mouth 2 (two) times daily. Patient not taking: Reported on 08/28/2016 04/10/15   Arthor Captain, PA-C    Family History History reviewed. No pertinent family history.  Social History Social History   Tobacco Use  . Smoking status: Never Smoker  . Smokeless tobacco: Never Used  Substance Use Topics  . Alcohol use: Yes    Comment: occasionally  . Drug use: No     Allergies   Sulfa antibiotics   Review of Systems Review of Systems  Constitutional: Positive for fatigue. Negative for chills and fever.  Respiratory: Negative for shortness of breath.   Cardiovascular: Negative for chest pain.  Gastrointestinal: Negative for abdominal pain, diarrhea, nausea and vomiting.  Musculoskeletal: Negative for back pain and neck pain.  Skin: Positive for wound. Negative for rash.  Neurological: Positive for syncope, light-headedness and headaches. Negative for seizures, weakness and numbness.  All other systems reviewed and are negative.    Physical Exam Updated Vital Signs BP 121/72   Pulse 69   Temp 98.3 F (36.8 C) (Oral)   Resp 18   SpO2 98%   Physical Exam  Constitutional: He is oriented to person, place, and time. He appears well-developed and well-nourished. No distress.  HENT:  Head: Normocephalic.  Mouth/Throat: Oropharynx is clear and moist.  Patient with linear laceration bisecting and perpendicular  to the right eyebrow.  Midface is stable.  No malocclusion.  No obvious scalp trauma.  Eyes: Pupils are equal, round, and reactive to light. EOM are normal.  Conjunctive are mildly injected.  Neck: Normal range of motion. Neck supple.  No posterior midline cervical tenderness to palpation.  No meningismus.  Cardiovascular: Normal rate and regular rhythm. Exam reveals no gallop and no friction rub.  No murmur heard. Pulmonary/Chest: Effort normal and  breath sounds normal. No stridor. No respiratory distress. He has no wheezes. He has no rales. He exhibits no tenderness.  Abdominal: Soft. Bowel sounds are normal. There is no tenderness. There is no rebound and no guarding.  Musculoskeletal: Normal range of motion. He exhibits no edema or tenderness.  Distal pulses intact.  No lower extremity swelling, asymmetry or tenderness.  Neurological: He is alert and oriented to person, place, and time.  Moves all extremities without focal deficit.  Sensation intact.  Skin: Skin is warm and dry. No rash noted. He is not diaphoretic. No erythema.  Psychiatric: He has a normal mood and affect. His behavior is normal.  Nursing note and vitals reviewed.    ED Treatments / Results  Labs (all labs ordered are listed, but only abnormal results are displayed) Labs Reviewed  COMPREHENSIVE METABOLIC PANEL - Abnormal; Notable for the following components:      Result Value   Calcium 8.3 (*)    Total Protein 5.2 (*)    Albumin 3.2 (*)    Alkaline Phosphatase 30 (*)    All other components within normal limits  ETHANOL - Abnormal; Notable for the following components:   Alcohol, Ethyl (B) 19 (*)    All other components within normal limits  URINALYSIS, ROUTINE W REFLEX MICROSCOPIC - Abnormal; Notable for the following components:   Ketones, ur 20 (*)    All other components within normal limits  CBC WITH DIFFERENTIAL/PLATELET - Abnormal; Notable for the following components:   WBC 11.9 (*)    Neutro Abs 9.9 (*)    All other components within normal limits  CBC WITH DIFFERENTIAL/PLATELET  CBC WITH DIFFERENTIAL/PLATELET  CBC WITH DIFFERENTIAL/PLATELET    EKG EKG Interpretation  Date/Time:  Thursday January 19 2018 18:19:18 EDT Ventricular Rate:  78 PR Interval:    QRS Duration: 88 QT Interval:  370 QTC Calculation: 422 R Axis:   72 Text Interpretation:  Sinus rhythm Confirmed by Loren Racer ([id number]) on 01/19/2018 6:41:14 PM   Radiology Ct  Head Wo Contrast  Result Date: 01/19/2018 CLINICAL DATA:  Right-sided head pain after syncopal episode with injury to head against door. EXAM: CT HEAD WITHOUT CONTRAST TECHNIQUE: Contiguous axial images were obtained from the base of the skull through the vertex without intravenous contrast. COMPARISON:  None. FINDINGS: Brain: No evidence of acute infarction, hemorrhage, hydrocephalus, extra-axial collection or mass lesion/mass effect. Vascular: No hyperdense vessel or unexpected calcification. Skull: Normal. Negative for fracture or focal lesion. Sinuses/Orbits: Orbits are normal. There is opacification of the maxillary sinuses and sphenoid sinus compatible chronic inflammatory change. Mastoid air cells are clear. Other: None. IMPRESSION: No acute findings. Chronic sinus inflammatory change. Electronically Signed   By: Elberta Fortis M.D.   On: 01/19/2018 19:06    Procedures .Marland KitchenLaceration Repair Date/Time: 01/19/2018 8:37 PM Performed by: Loren Racer, MD Authorized by: Loren Racer, MD   Consent:    Consent obtained:  Verbal   Consent given by:  Patient Anesthesia (see MAR for exact dosages):    Anesthesia method:  Local  infiltration   Local anesthetic:  Lidocaine 2% WITH epi Laceration details:    Location:  Face   Face location:  R eyebrow   Length (cm):  4   Depth (mm):  1 Repair type:    Repair type:  Simple Pre-procedure details:    Preparation:  Patient was prepped and draped in usual sterile fashion Exploration:    Wound exploration: entire depth of wound probed and visualized     Wound extent: no foreign bodies/material noted     Contaminated: no   Treatment:    Area cleansed with:  Hibiclens   Amount of cleaning:  Standard   Visualized foreign bodies/material removed: no   Skin repair:    Repair method:  Sutures   Suture size:  5-0   Suture material:  Prolene   Suture technique:  Simple interrupted   Number of sutures:  4 Approximation:    Approximation:   Close Post-procedure details:    Dressing:  Antibiotic ointment   Patient tolerance of procedure:  Tolerated well, no immediate complications   (including critical care time)  Medications Ordered in ED Medications  Tdap (BOOSTRIX) injection 0.5 mL (0.5 mLs Intramuscular Given 01/19/18 1945)  sodium chloride 0.9 % bolus 1,000 mL (0 mLs Intravenous Stopped 01/19/18 2103)  lidocaine-EPINEPHrine (XYLOCAINE W/EPI) 2 %-1:200000 (PF) injection 10 mL (10 mLs Infiltration Given 01/19/18 1959)     Initial Impression / Assessment and Plan / ED Course  I have reviewed the triage vital signs and the nursing notes.  Pertinent labs & imaging results that were available during my care of the patient were reviewed by me and considered in my medical decision making (see chart for details).    Patient is at his baseline mental status.  Normal neurologic exam.  CT head without acute findings.  Right eyebrow laceration repaired with good cosmetic results.  Understands need to follow-up in the emergency department or with his primary doctor in 1 week to have sutures removed.  Head injury precautions given.  Advised to drink sure he is drinking plenty fluids and staying well-hydrated.   Final Clinical Impressions(s) / ED Diagnoses   Final diagnoses:  Syncope and collapse  Dehydration  Eyebrow laceration, right, initial encounter    ED Discharge Orders    None       Loren RacerYelverton, Jhoan Schmieder, MD 01/19/18 2127

## 2018-04-03 ENCOUNTER — Ambulatory Visit (HOSPITAL_COMMUNITY)
Admission: EM | Admit: 2018-04-03 | Discharge: 2018-04-03 | Disposition: A | Discharge status: Home / Self Care | Payer: Managed Care, Other (non HMO) | Attending: Family Medicine | Admitting: Family Medicine

## 2018-04-03 ENCOUNTER — Ambulatory Visit (INDEPENDENT_AMBULATORY_CARE_PROVIDER_SITE_OTHER): Payer: Managed Care, Other (non HMO)

## 2018-04-03 ENCOUNTER — Encounter (HOSPITAL_COMMUNITY): Payer: Self-pay | Admitting: Emergency Medicine

## 2018-04-03 DIAGNOSIS — S67191A Crushing injury of left index finger, initial encounter: Secondary | ICD-10-CM | POA: Diagnosis not present

## 2018-04-03 DIAGNOSIS — S62661A Nondisplaced fracture of distal phalanx of left index finger, initial encounter for closed fracture: Secondary | ICD-10-CM

## 2018-04-03 MED ORDER — MELOXICAM 15 MG PO TABS: ORAL_TABLET | ORAL | 0 refills | Status: DC

## 2018-04-03 NOTE — ED Provider Notes (Signed)
[facility]    CSN: [id number] Arrival date & time: 04/03/18  1925     History   Chief Complaint Chief Complaint  Patient presents with  . Finger Injury    HPI Terry Hurst is a 32 y.o. male is presenting with an injury to his left index finger.  He was at work and smashed it between 2 barrels.  He is having pain on his distal phalanx of his left index finger.  He is having a subungual hematoma as well as bruising and swelling of the distal phalanx.  He denies any altered sensation or loss of sensation.  He is still having blood flow through the finger.  The pain is severe in nature.  The pain is exacerbated with touch or movement.  This occurred earlier today.  His symptoms are staying the same.   HPI  History reviewed. No pertinent past medical history.  There are no active problems to display for this patient.   History reviewed. No pertinent surgical history.     Home Medications    Prior to Admission medications   Medication Sig Start Date End Date Taking? Authorizing Provider  HYDROcodone-acetaminophen (NORCO) 5-325 MG per tablet Take 2 tablets by mouth every 4 (four) hours as needed. Patient not taking: Reported on 08/28/2016 04/10/15   Arthor Captain, PA-C  ibuprofen (ADVIL,MOTRIN) 600 MG tablet Take 1 tablet (600 mg total) every 6 (six) hours as needed by mouth. Patient not taking: Reported on 01/19/2018 05/23/17   Dartha Lodge, PA-C  lidocaine (LIDODERM) 5 % Place 1 patch onto the skin daily. Remove & Discard patch within 12 hours or as directed by MD Patient not taking: Reported on 08/28/2016 04/10/15   Arthor Captain, PA-C  meloxicam (MOBIC) 15 MG tablet One tab PO qAM with breakfast for 2 weeks, then daily prn pain. 04/03/18   Myra Rude, MD  methocarbamol (ROBAXIN) 500 MG tablet Take 1 tablet (500 mg total) 2 (two) times daily by mouth. Patient not taking: Reported on 01/19/2018 05/23/17   Dartha Lodge, PA-C  naproxen (NAPROSYN) 375 MG tablet  Take 1 tablet (375 mg total) by mouth 2 (two) times daily. Patient not taking: Reported on 08/28/2016 04/10/15   Arthor Captain, PA-C    Family History No family history on file.  Social History Social History   Tobacco Use  . Smoking status: Never Smoker  . Smokeless tobacco: Never Used  Substance Use Topics  . Alcohol use: Yes    Comment: occasionally  . Drug use: No     Allergies   Sulfa antibiotics   Review of Systems Review of Systems  Constitutional: Negative for fever.  HENT: Negative for congestion.   Respiratory: Negative for cough.   Cardiovascular: Negative for chest pain.  Gastrointestinal: Negative for abdominal pain.  Musculoskeletal: Positive for joint swelling.  Skin: Positive for color change.  Neurological: Negative for seizures.  Hematological: Negative for adenopathy.  Psychiatric/Behavioral: Negative for agitation.     Physical Exam Triage Vital Signs ED Triage Vitals [04/03/18 1949]  Enc Vitals Group     BP 127/87     Pulse Rate 72     Resp 16     Temp 97.9 F (36.6 C)     Temp src      SpO2 99 %     Weight      Height      Head Circumference      Peak Flow      Pain  Score      Pain Loc      Pain Edu?      Excl. in GC?    No data found.  Updated Vital Signs BP 127/87   Pulse 72   Temp 97.9 F (36.6 C)   Resp 16   SpO2 99%   Visual Acuity Right Eye Distance:   Left Eye Distance:   Bilateral Distance:    Right Eye Near:   Left Eye Near:    Bilateral Near:     Physical Exam Gen: NAD, alert, cooperative with exam, well-appearing ENT: normal lips, normal nasal mucosa,  Eye: normal EOM, normal conjunctiva and lids CV:  no edema, +2 pedal pulses   Resp: no accessory muscle use, non-labored,  Skin: no rashes, no areas of induration  Neuro: normal tone, normal sensation to touch Psych:  normal insight, alert and oriented MSK:  Left index finger. Distal phalanx has ecchymosis and swelling. Able to achieve flexion and  extension. Has a subungual hematoma and nail is in place. Normal movement of the PIP and MCP joint. Neurovascular intact   UC Treatments / Results  Labs (all labs ordered are listed, but only abnormal results are displayed) Labs Reviewed - No data to display  EKG None  Radiology Dg Finger Index Left  Result Date: 04/03/2018 CLINICAL DATA:  Crush injury.  Pain. EXAM: LEFT INDEX FINGER 2+V COMPARISON:  None. FINDINGS: There is a crush injury to the tuft of the distal phalanx, mild displacement of the fracture fragments. No transverse fracture of the shaft. No involvement of the DIP joint. No foreign body. Soft tissue swelling is present. IMPRESSION: Crushing injury to the distal phalanx of the LEFT index finger. Electronically Signed   By: Elsie Stain M.D.   On: 04/03/2018 20:15    Procedures Procedures (including critical care time)  Medications Ordered in UC Medications - No data to display  Initial Impression / Assessment and Plan / UC Course  I have reviewed the triage vital signs and the nursing notes.  Pertinent labs & imaging results that were available during my care of the patient were reviewed by me and considered in my medical decision making (see chart for details).     [name] presents today with an injury to his left index finger that occurred at work and appears to be fractured at the distal phalanx.  He had a crushing injury and appears to have normal sensation and maintains flexion extension.  He was placed in a finger splint to keep the DIP in extension today.  He is provided with an anti-inflammatory for his pain.  He was advised to follow-up in order to have another image with a primary doctor somewhat in his network.  He was given precautions in order to follow-up sooner if his symptoms change with his sensation changes.  Final Clinical Impressions(s) / UC Diagnoses   Final diagnoses:  Closed nondisplaced fracture of distal phalanx of left index finger,  initial encounter     Discharge Instructions     Please continue to wear the brace for the next 3 weeks. The nailbed may fall off. Please use the anti-inflammatory to help with any swelling. Please use ice on the finger. Please follow-up if you notice any changes in the blood flow or you have any numbness.     ED Prescriptions    Medication Sig Dispense Auth. Provider   meloxicam (MOBIC) 15 MG tablet One tab PO qAM with breakfast for 2 weeks, then  daily prn pain. 30 tablet Myra RudeSchmitz, Ashby Leflore E, MD     Controlled Substance Prescriptions [place] Controlled Substance Registry consulted? Not Applicable   Myra RudeSchmitz, Latravion Graves E, MD 04/03/18 2126

## 2018-04-03 NOTE — Discharge Instructions (Signed)
Please continue to wear the brace for the next 3 weeks. The nailbed may fall off. Please use the anti-inflammatory to help with any swelling. Please use ice on the finger. Please follow-up if you notice any changes in the blood flow or you have any numbness.

## 2018-04-03 NOTE — ED Triage Notes (Signed)
Pt states he slammed his left pointer finger in between two oil barrels today. Finger is swollen, nail bed is bruised.

## 2018-10-20 ENCOUNTER — Other Ambulatory Visit: Payer: Self-pay

## 2018-10-20 ENCOUNTER — Emergency Department (HOSPITAL_COMMUNITY)
Admission: EM | Admit: 2018-10-20 | Discharge: 2018-10-20 | Disposition: A | Discharge status: Home / Self Care | Payer: Managed Care, Other (non HMO) | Attending: Emergency Medicine | Admitting: Emergency Medicine

## 2018-10-20 DIAGNOSIS — Z7689 Persons encountering health services in other specified circumstances: Secondary | ICD-10-CM

## 2018-10-20 DIAGNOSIS — Z0279 Encounter for issue of other medical certificate: Secondary | ICD-10-CM | POA: Diagnosis not present

## 2018-10-20 NOTE — ED Provider Notes (Signed)
[facility] EMERGENCY DEPARTMENT Provider Note   CSN: [id number] Arrival date & time: 10/20/18  4098    History   Chief Complaint Chief Complaint  Patient presents with  . NEEDS DR. NOTE    HPI Terry Hurst is a 33 y.o. male with no significant past medical history who presents for evaluation of work note. States live in girlfriend was tested for Coronavirus on Monday. She received the test results yesterday evening which were negative.  Patient states his employer told him he had a stay at work until her test results were back.  Denies any recent travel or recent contact with known lab positive coronavirus patients.  Denies fever, chills, cough, rhinorrhea, congestion, neck pain, neck stiffness, chest pain, shortness of breath, abdominal pain, nausea, vomiting or diarrhea.  Patient denies any additional aggravating or alleviating factors.  Patient states she does not have a PCP.  Patient requesting to go back to work today.  History obtained from patient.  No intercourse use.     HPI  No past medical history on file.  There are no active problems to display for this patient.   No past surgical history on file.      Home Medications    Prior to Admission medications   Medication Sig Start Date End Date Taking? Authorizing Provider  HYDROcodone-acetaminophen (NORCO) 5-325 MG per tablet Take 2 tablets by mouth every 4 (four) hours as needed. Patient not taking: Reported on 08/28/2016 04/10/15   Arthor Captain, PA-C  ibuprofen (ADVIL,MOTRIN) 600 MG tablet Take 1 tablet (600 mg total) every 6 (six) hours as needed by mouth. Patient not taking: Reported on 01/19/2018 05/23/17   Dartha Lodge, PA-C  lidocaine (LIDODERM) 5 % Place 1 patch onto the skin daily. Remove & Discard patch within 12 hours or as directed by MD Patient not taking: Reported on 08/28/2016 04/10/15   Arthor Captain, PA-C  meloxicam (MOBIC) 15 MG tablet One tab PO qAM with breakfast for 2 weeks,  then daily prn pain. 04/03/18   Myra Rude, MD  methocarbamol (ROBAXIN) 500 MG tablet Take 1 tablet (500 mg total) 2 (two) times daily by mouth. Patient not taking: Reported on 01/19/2018 05/23/17   Dartha Lodge, PA-C  naproxen (NAPROSYN) 375 MG tablet Take 1 tablet (375 mg total) by mouth 2 (two) times daily. Patient not taking: Reported on 08/28/2016 04/10/15   Arthor Captain, PA-C    Family History No family history on file.  Social History Social History   Tobacco Use  . Smoking status: Never Smoker  . Smokeless tobacco: Never Used  Substance Use Topics  . Alcohol use: Yes    Comment: occasionally  . Drug use: No     Allergies   Sulfa antibiotics   Review of Systems Review of Systems  Constitutional: Negative.   HENT: Negative.   Eyes: Negative.   Respiratory: Negative.   Cardiovascular: Negative.   Gastrointestinal: Negative.   Genitourinary: Negative.   Musculoskeletal: Negative.   Skin: Negative.   Neurological: Negative.   All other systems reviewed and are negative.    Physical Exam Updated Vital Signs BP (!) 141/96   Pulse 96   Temp 98.8 F (37.1 C) (Oral)   Resp 18   Ht  (1.778 m)   Wt 100.7 kg   SpO2 98%   BMI 31.85 kg/m   Physical Exam Vitals signs and nursing note reviewed.  Constitutional:      General: He is not  in acute distress.    Appearance: He is well-developed. He is not ill-appearing, toxic-appearing or diaphoretic.     Comments: Sitting in bed texting on initial evaluation. No acute distress noted.  HENT:     Head: Normocephalic and atraumatic.     Jaw: There is normal jaw occlusion.     Right Ear: Tympanic membrane, ear canal and external ear normal. Tympanic membrane is not perforated, erythematous, retracted or bulging.     Left Ear: Tympanic membrane, ear canal and external ear normal. Tympanic membrane is not perforated, erythematous, retracted or bulging.     Nose: Nose normal.     Right Sinus: No frontal  sinus tenderness.     Left Sinus: No maxillary sinus tenderness or frontal sinus tenderness.     Comments: No rhinorrhea or congestion.    Mouth/Throat:     Comments: Posterior oropharynx clear. Mucous membranes moist. No OP erythema or exudate. No tonsillar edema, erythema or exudate. Uvula midline without deviation. No drooling, dysphagia or trismus. Eyes:     Pupils: Pupils are equal, round, and reactive to light.  Neck:     Musculoskeletal: Full passive range of motion without pain, normal range of motion and neck supple.     Comments: No neck stiffness or neck rigidity. No meningismus.  Cardiovascular:     Rate and Rhythm: Normal rate and regular rhythm.     Pulses: Normal pulses.     Heart sounds: Normal heart sounds.  Pulmonary:     Effort: Pulmonary effort is normal. No respiratory distress.     Comments: Clear to ascultation bilaterally. No wheeze, rhonchi or rales.  No accessory muscle usage.  Able speak in full sentences without difficulty. Abdominal:     General: There is no distension.     Palpations: Abdomen is soft.     Comments: Soft, Nontender without rebound or guarding.  Musculoskeletal: Normal range of motion.     Comments: Moves all 4 extremities without difficulty.  Skin:    General: Skin is warm and dry.     Comments: Brisk capillary refill.  Neurological:     Mental Status: He is alert.    ED Treatments / Results  Labs (all labs ordered are listed, but only abnormal results are displayed) Labs Reviewed - No data to display  EKG None  Radiology No results found.  Procedures Procedures (including critical care time)  Medications Ordered in ED Medications - No data to display  Initial Impression / Assessment and Plan / ED Course  I have reviewed the triage vital signs and the nursing notes.  Pertinent labs & imaging results that were available during my care of the patient were reviewed by me and considered in my medical decision making (see  chart for details).  33 year old appears otherwise well presents for evaluation of work note.  Afebrile, nonseptic, non-ill-appearing.  Benign exam.  No current complaints. Girlfriend had symptoms of coronavirus and has recently tested negative, per patient.  I do not have access to girlfriend's records to verify this.  Patient is currently asymptomatic and has not experienced SX previously. Sent home from work on quarantine out of precaution for possible exposure. Will provide return to work note.  Had mildly elevated blood pressure in department.  Denies history of hypertension.  Asymptomatic without headache, vision changes, chest pain, shortness of breath, nausea vomiting abdominal pain.  Discussed with patient follow-up with PCP for reevaluation of this.  Patient given resources to establish care.  Patient hemodynamically  stable and appropriate for DC at this time.  I discussed return precautions.  Patient voiced understanding and is agreeable for follow-up     Final Clinical Impressions(s) / ED Diagnoses   Final diagnoses:  Return to work exam    ED Discharge Orders    None       Henderly, Britni A, PA-C 10/20/18 0845    Arby Barrette, MD 10/21/18 1113

## 2018-10-20 NOTE — Discharge Instructions (Signed)
Your evaluated today for return to work.  I have written you a work note.  Blood pressure was mildly elevated while you are here.  Please follow-up with your PCP for reevaluation.  If you do not have one I have referred you to 1.  Their contact information is listed in your discharge paperwork.

## 2018-10-20 NOTE — ED Notes (Signed)
Patient verbalizes understanding of discharge instructions. Opportunity for questioning and answering were provided.  patient discharged from ED.  

## 2018-10-20 NOTE — ED Triage Notes (Signed)
Pt states girlfriend recently tested negative for corona virus and now work is requiring patient to have Dr. Note saying its ok to to return work ; pt denies any SOB/cough/fevers

## 2019-01-09 IMAGING — CT CT HEAD W/O CM
4 series · 16 of 47 positions shown, 18 images · non-contrast
Comparison: None.

CLINICAL DATA: Right-sided head pain after syncopal episode with
injury to head against door.

EXAM:
CT HEAD WITHOUT CONTRAST
TECHNIQUE: Contiguous axial images were obtained from the base of the skull
through the vertex without intravenous contrast.

[Series 3: head wo · axial · 0.46mm/px · z∈[-114,+6]mm · 7 of 32 slices shown, 9 images]
[im 4/32  brain]
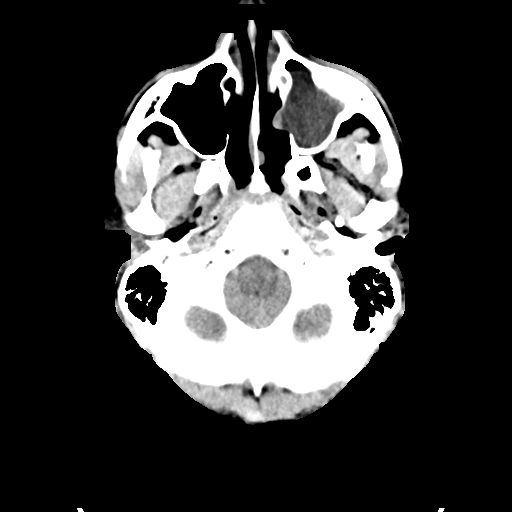
[im 4/32  bone]
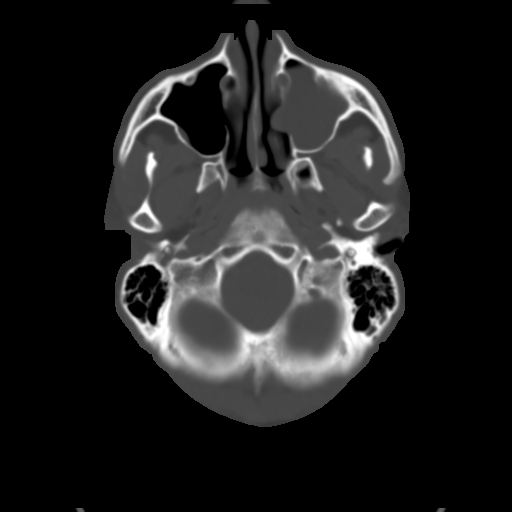
[im 8/32  brain]
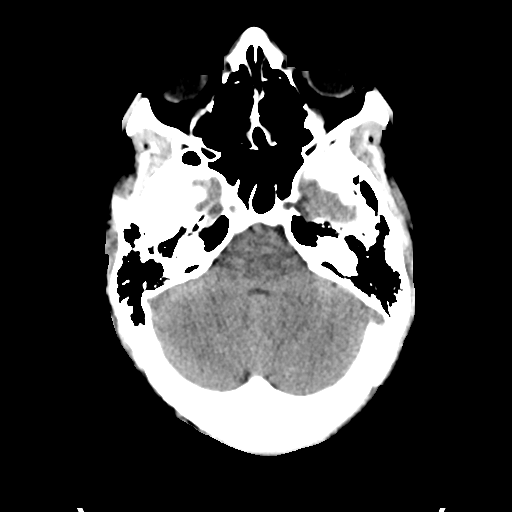
[im 12/32  brain]
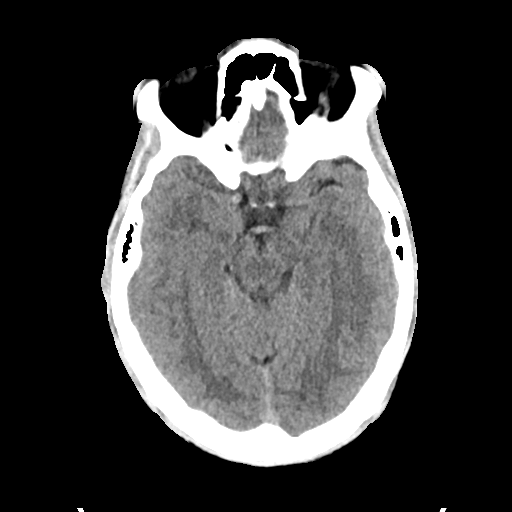
[im 16/32  brain]
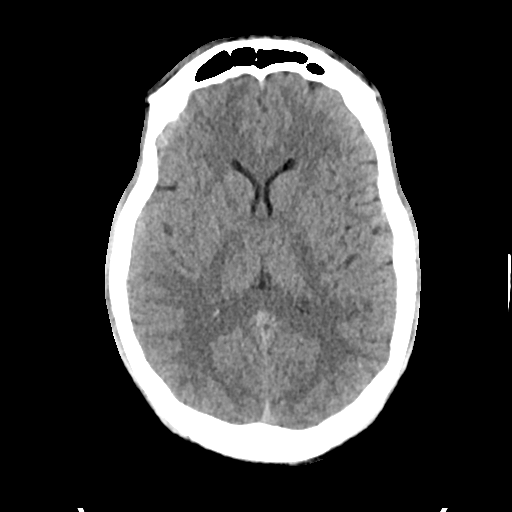
[im 20/32  brain]
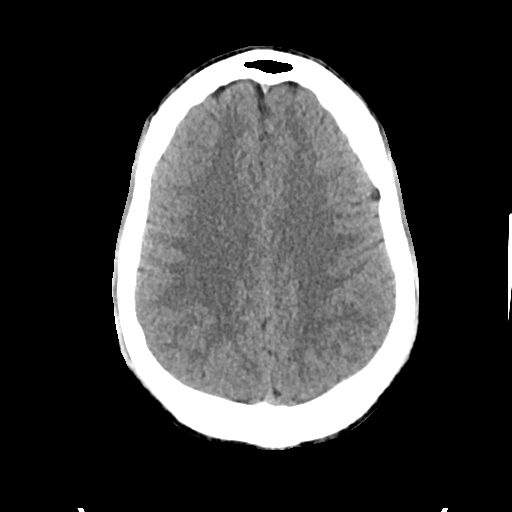
[im 20/32  bone]
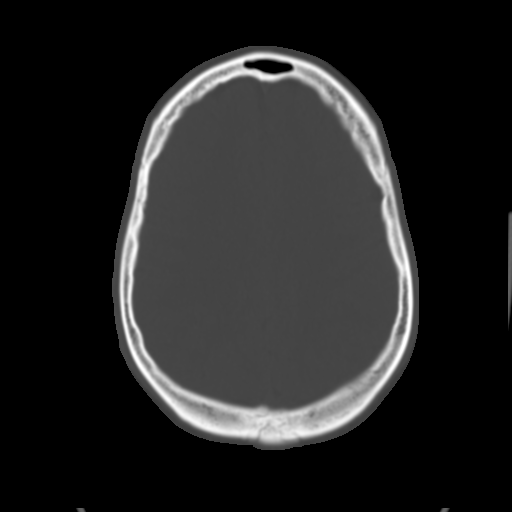
[im 24/32  brain]
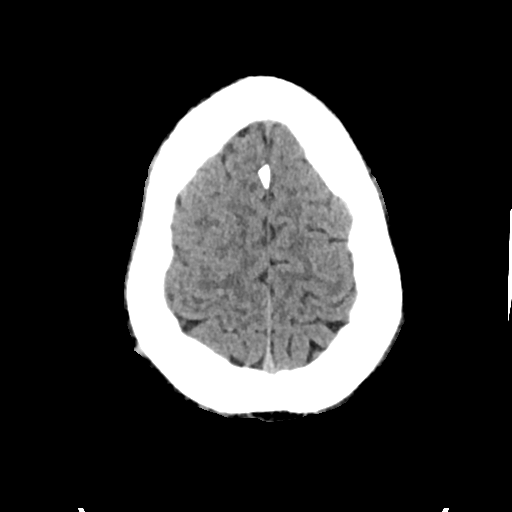
[im 28/32  brain]
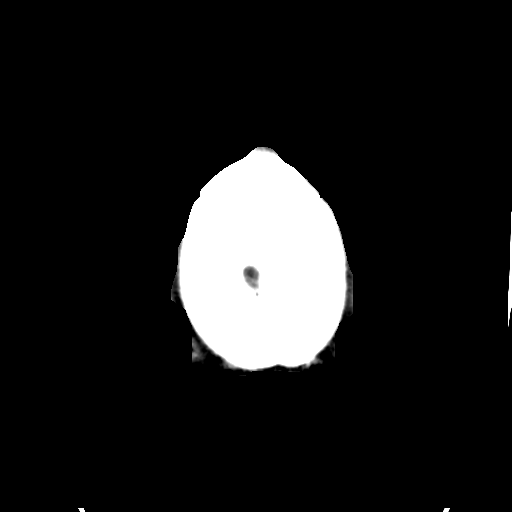

[Series 4: head bone · axial · 0.46mm/px · z∈[-116,-84]mm · 3 of 79 slices shown]
[im 8/79  bone]
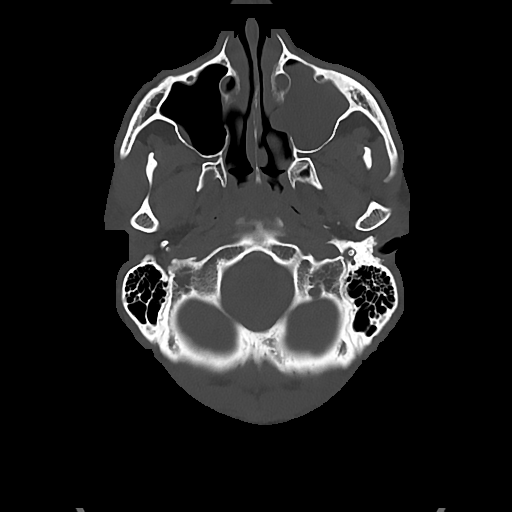
[im 16/79  bone]
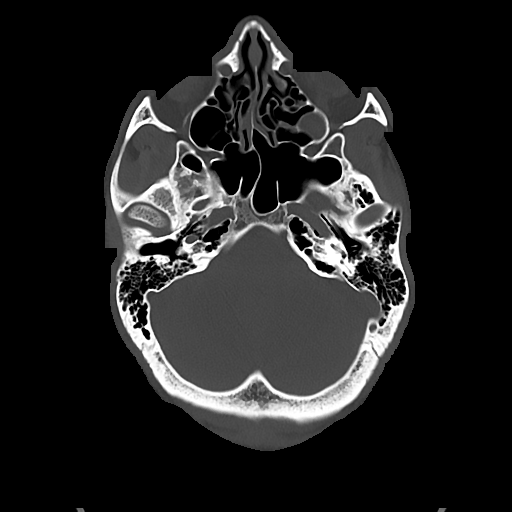
[im 24/79  bone]
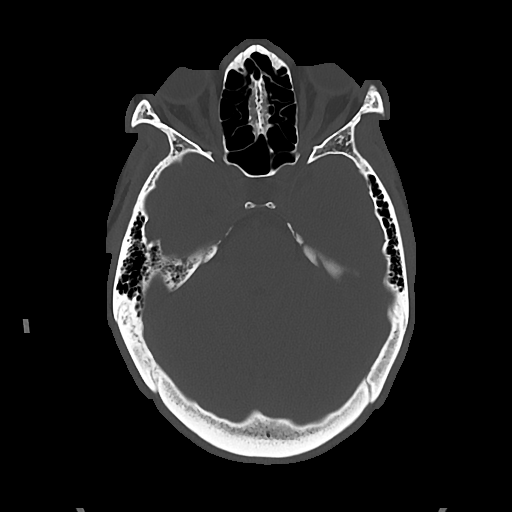

[Series 5: cor soft · coronal · 0.30mm/px · 3 of 75 slices shown]
[im 25/75  brain]
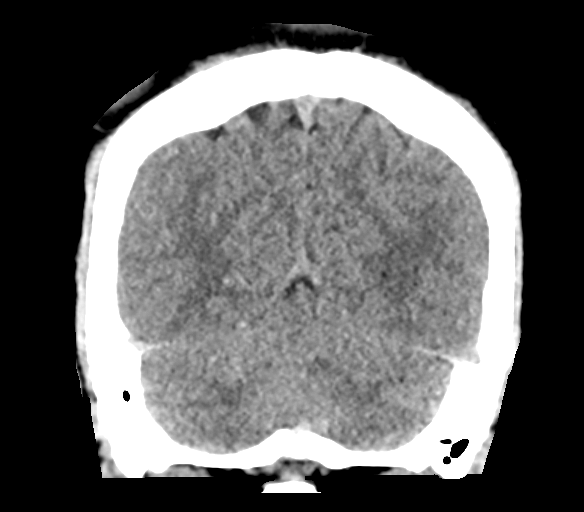
[im 33/75  brain]
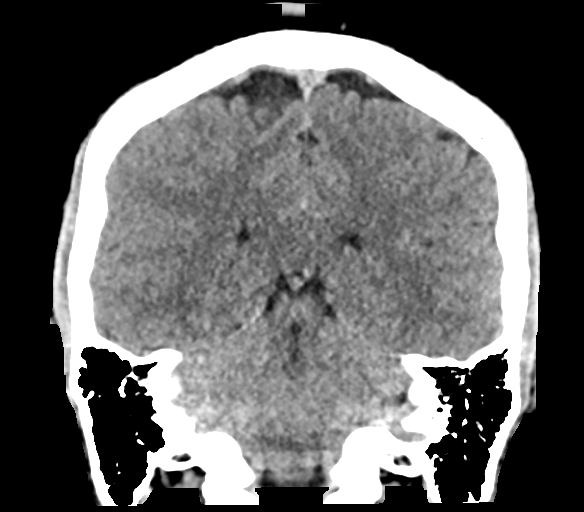
[im 42/75  brain]
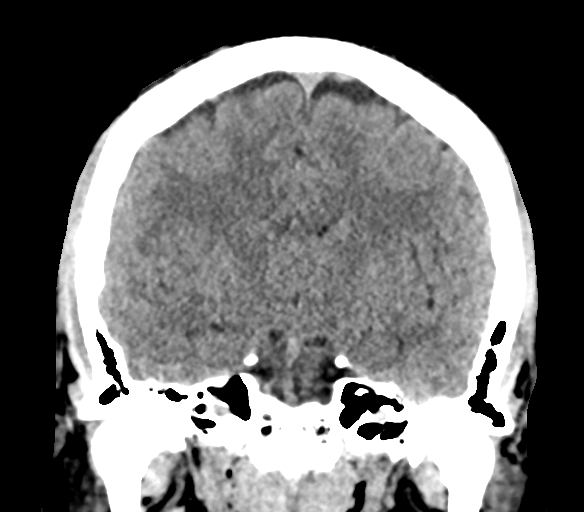

[Series 6: sag soft · sagittal · 0.30mm/px · 3 of 61 slices shown]
[im 21/61  brain]
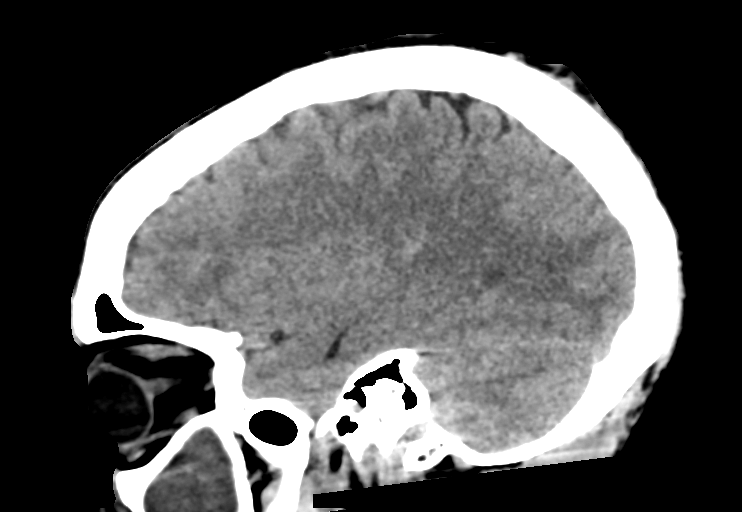
[im 31/61  brain]
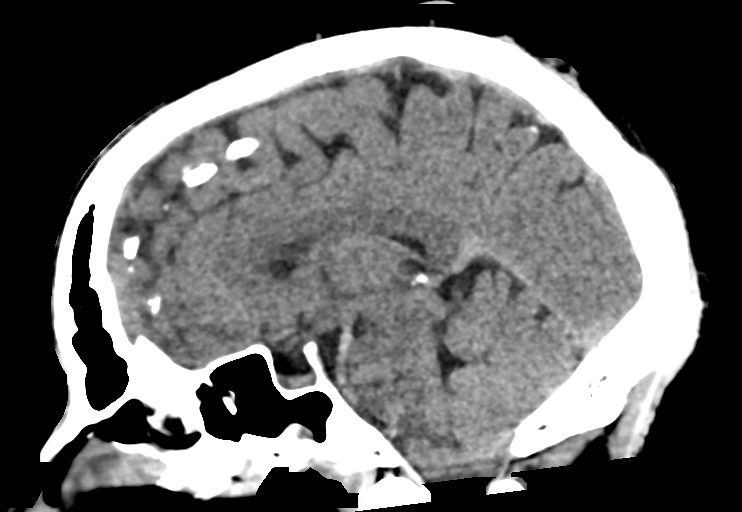
[im 41/61  brain]
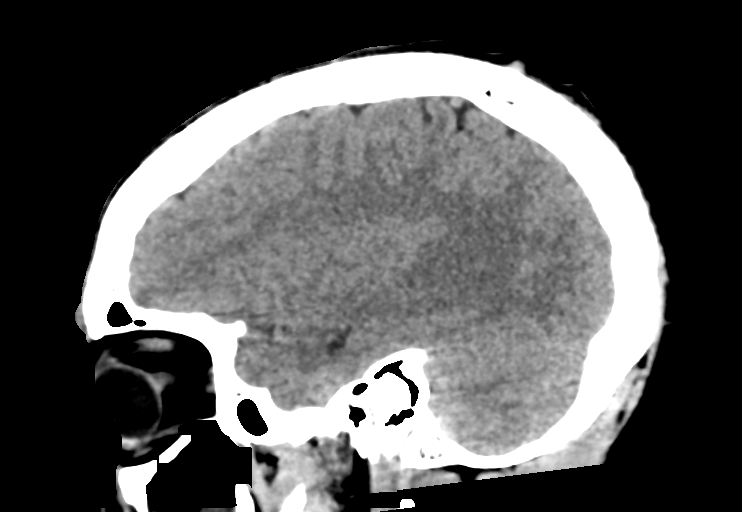

[16 of 47 positions shown; findings below may reference images not displayed]

FINDINGS: Brain: No evidence of acute infarction, hemorrhage, hydrocephalus,
extra-axial collection or mass lesion/mass effect.

Vascular: No hyperdense vessel or unexpected calcification.

Skull: Normal. Negative for fracture or focal lesion.

Sinuses/Orbits: Orbits are normal. There is opacification of the
maxillary sinuses and sphenoid sinus compatible chronic inflammatory
change. Mastoid air cells are clear.

Other: None.
IMPRESSION: No acute findings.

Chronic sinus inflammatory change.

## 2019-03-21 ENCOUNTER — Other Ambulatory Visit: Payer: Self-pay

## 2019-03-21 ENCOUNTER — Encounter (HOSPITAL_COMMUNITY): Payer: Self-pay

## 2019-03-21 ENCOUNTER — Ambulatory Visit (HOSPITAL_COMMUNITY)
Admission: EM | Admit: 2019-03-21 | Discharge: 2019-03-21 | Disposition: A | Discharge status: Home / Self Care | Payer: Self-pay | Attending: Family Medicine | Admitting: Family Medicine

## 2019-03-21 DIAGNOSIS — Z23 Encounter for immunization: Secondary | ICD-10-CM

## 2019-03-21 DIAGNOSIS — S99921A Unspecified injury of right foot, initial encounter: Secondary | ICD-10-CM

## 2019-03-21 MED ORDER — TETANUS-DIPHTH-ACELL PERTUSSIS 5-2.5-18.5 LF-MCG/0.5 IM SUSP
INTRAMUSCULAR | Status: AC
  Filled 2019-03-21: qty 0.5

## 2019-03-21 MED ORDER — TETANUS-DIPHTH-ACELL PERTUSSIS 5-2.5-18.5 LF-MCG/0.5 IM SUSP
0.5000 mL | Freq: Once | INTRAMUSCULAR | Status: AC
  Administered 2019-03-21: 0.5 mL via INTRAMUSCULAR

## 2019-03-21 NOTE — ED Triage Notes (Signed)
Pt states  He stepped on a nail today. ( right foot ). Pt states he has not had a tenuis shot.

## 2019-03-21 NOTE — Discharge Instructions (Addendum)
Meds ordered this encounter  Medications   Tdap (BOOSTRIX) injection 0.5 mL   You may use over the counter ibuprofen or acetaminophen as needed.

## 2019-03-22 NOTE — ED Provider Notes (Signed)
[facility]   [id number] 03/21/19 Arrival Time: 1551  ASSESSMENT & PLAN:  1. Right foot injury, initial encounter     No indication for imaging of foot at this time. Simple wound care discussed. Keep clean and dry. Watch for s/s of infection. WBAT.  Meds ordered this encounter  Medications  . Tdap (BOOSTRIX) injection 0.5 mL    Follow-up Information    [facility].   Specialty: Urgent Care Why: As needed. Contact information: [address] [place] [id number] [phone]          Reviewed expectations re: course of current medical issues. Questions answered. Outlined signs and symptoms indicating need for more acute intervention. Patient verbalized understanding. After Visit Summary given.  SUBJECTIVE: History from: patient. Terry Hurst is a 33 y.o. male who reports stepping on a nail today. Went through shoe. Small puncture. "Bled for a second then stopped". Still sore with weight bearing and better with elevating foot. No swelling. No extremity sensation changes or weakness. Able to bear weight and ambulate without difficulty. Cleaned wound at home. Feels Td is not UTD and requests. No analgesics needed.  History reviewed. No pertinent surgical history.   ROS: As per HPI. All other systems negative.    OBJECTIVE:  Vitals:   03/21/19 1609 03/21/19 1610  BP:  128/64  Pulse:  67  Resp:  18  Temp:  98.3 F (36.8 C)  TempSrc:  Oral  SpO2:  100%  Weight: 104.3 kg     General appearance: alert; no distress HEENT: [place]; AT Neck: supple with FROM Resp: unlabored respirations Extremities: . RLE: warm and well perfused; well localized mild tenderness over right distal plantar foot; without gross deformities; with no swelling; with no bruising; no active bleeding; ROM: normal without reported discomfort; no foreign body appreciated CV: brisk extremity capillary refill of RLE; 2+ DP/PT pulse of RLE.  Skin: warm and dry; no visible rashes Neurologic: gait normal; normal reflexes of RLE; normal sensation of RLE; normal strength of RLE Psychological: alert and cooperative; normal mood and affect   Allergies  Allergen Reactions  . Sulfa Antibiotics Other (See Comments)    Childhood    PMH: Finger fracture.  Social History   Socioeconomic History  . Marital status: Significant Other    Spouse name: Not on file  . Number of children: Not on file  . Years of education: Not on file  . Highest education level: Not on file  Occupational History  . Not on file  Social Needs  . Financial resource strain: Not on file  . Food insecurity    Worry: Not on file    Inability: Not on file  . Transportation needs    Medical: Not on file    Non-medical: Not on file  Tobacco Use  . Smoking status: Never Smoker  . Smokeless tobacco: Never Used  Substance and Sexual Activity  . Alcohol use: Yes    Comment: occasionally  . Drug use: No  . Sexual activity: Not on file  Lifestyle  . Physical activity    Days per week: Not on file    Minutes per session: Not on file  . Stress: Not on file  Relationships  . Social Musicianconnections    Talks on phone: Not on file    Gets together: Not on file    Attends religious service: Not on file    Active member of club or organization: Not on file  Attends meetings of clubs or organizations: Not on file    Relationship status: Not on file  Other Topics Concern  . Not on file  Social History Narrative  . Not on file    History reviewed. No pertinent surgical history.    Vanessa Kick, MD 03/22/19 [phone]

## 2019-03-24 IMAGING — DX DG FINGER INDEX 2+V*L*
3 series · 3 of 3 positions shown · non-contrast
Comparison: None.

CLINICAL DATA: Crush injury.  Pain.

EXAM:
LEFT INDEX FINGER 2+V

[finger ap]
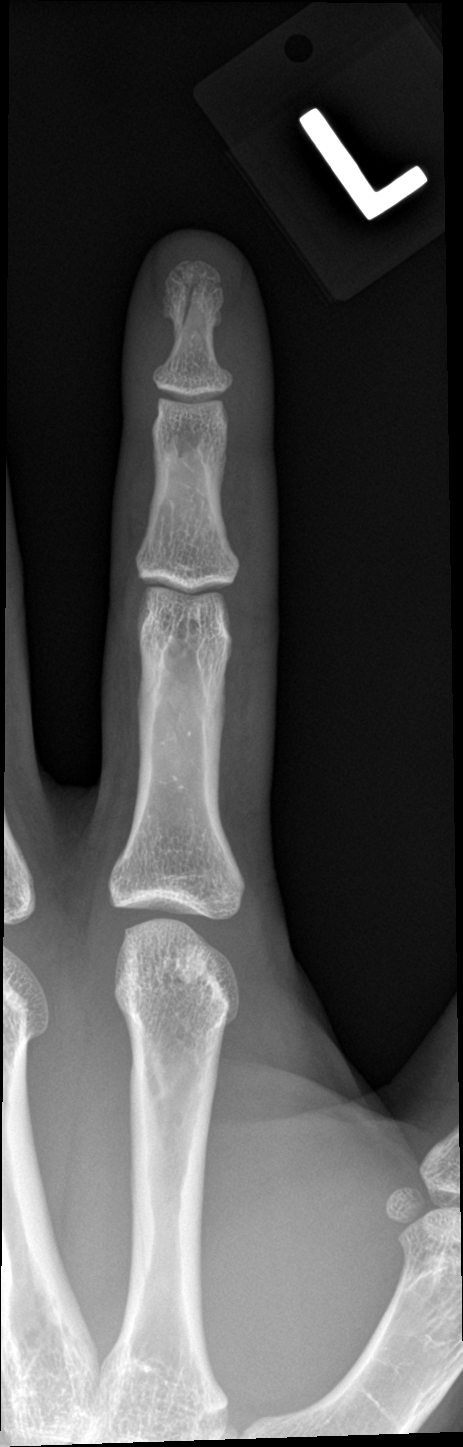

[finger obl]
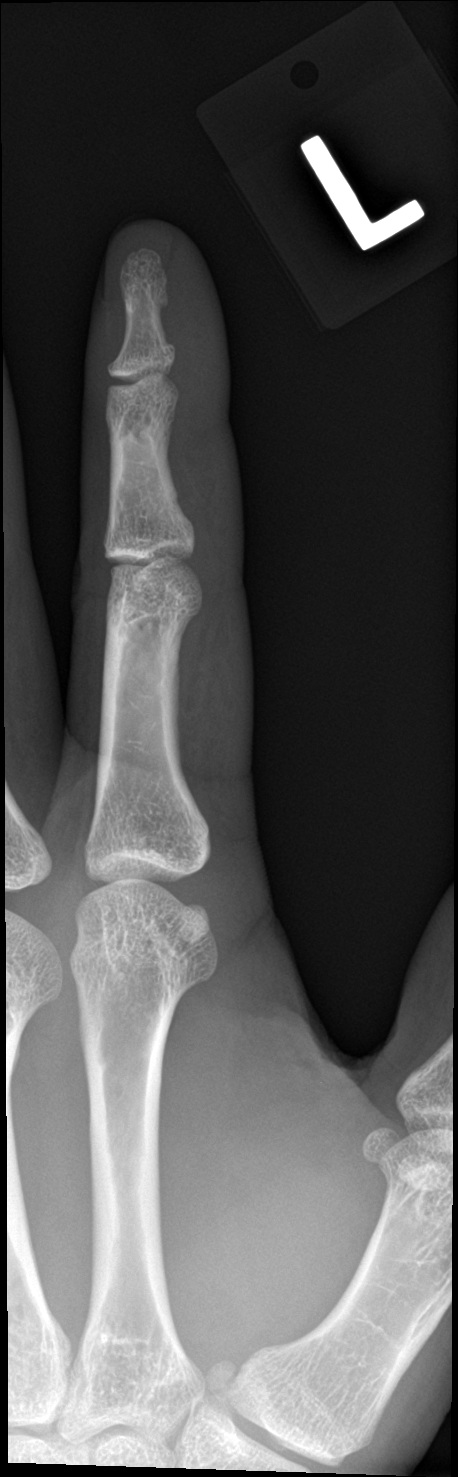

[finger lat]
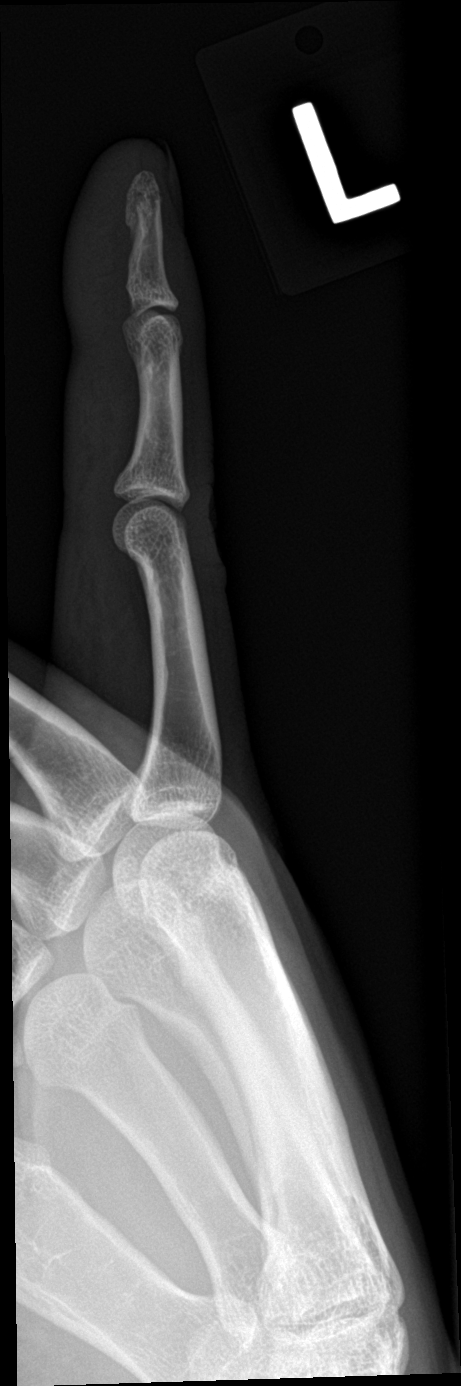

[3 of 3 positions shown; findings below may reference images not displayed]

FINDINGS: There is a crush injury to the tuft of the distal phalanx, mild
displacement of the fracture fragments. No transverse fracture of
the shaft. No involvement of the DIP joint. No foreign body. Soft
tissue swelling is present.
IMPRESSION: Crushing injury to the distal phalanx of the LEFT index finger.

## 2019-08-03 ENCOUNTER — Other Ambulatory Visit: Payer: Managed Care, Other (non HMO)

## 2019-10-30 ENCOUNTER — Emergency Department (HOSPITAL_COMMUNITY)
Admission: EM | Admit: 2019-10-30 | Discharge: 2019-10-30 | Disposition: A | Discharge status: Home / Self Care | Payer: Managed Care, Other (non HMO) | Attending: Emergency Medicine | Admitting: Emergency Medicine

## 2019-10-30 ENCOUNTER — Encounter (HOSPITAL_COMMUNITY): Payer: Self-pay | Admitting: Emergency Medicine

## 2019-10-30 ENCOUNTER — Other Ambulatory Visit: Payer: Self-pay

## 2019-10-30 DIAGNOSIS — B349 Viral infection, unspecified: Secondary | ICD-10-CM | POA: Insufficient documentation

## 2019-10-30 DIAGNOSIS — Z20822 Contact with and (suspected) exposure to covid-19: Secondary | ICD-10-CM | POA: Insufficient documentation

## 2019-10-30 LAB — RESPIRATORY PANEL BY RT PCR (FLU A&B, COVID)
Influenza A by PCR: NEGATIVE
Influenza B by PCR: NEGATIVE
SARS Coronavirus 2 by RT PCR: NEGATIVE

## 2019-10-30 MED ORDER — ACETAMINOPHEN 325 MG PO TABS
650.0000 mg | ORAL_TABLET | Freq: Once | ORAL | Status: AC
  Administered 2019-10-30: 21:00:00 650 mg via ORAL
  Filled 2019-10-30: qty 2

## 2019-10-30 NOTE — ED Provider Notes (Signed)
[facility] EMERGENCY DEPARTMENT Provider Note   CSN: [id number] Arrival date & time: 10/30/19  1720     History Chief Complaint  Patient presents with  . Chills    Grigor Lipschutz is a 34 y.o. male with no significant past medical history who presents to the ED due to sudden onset of fever, chills, and frontal headache that started today at work.  Patient notes his headache is located on the bilateral frontal region and is throbbing in nature.  Denies sudden onset and worse intensity at onset.  Patient denies sick contacts and known Covid exposures.  Patient has not received his Covid vaccine yet.  Patient denies associated neck stiffness, photophobia, visual changes, nausea, vomiting.  Patient denies sore throat, cough, shortness of breath, chest pain, lower extremity edema.  He has not tried anything for his symptoms.  No aggravating or alleviating factors.  Denies rash.  Patient notes this is not the worst headache he has ever had in his life.  Denies neck pain.  History obtained from patient and past medical records. No interpreter used during encounter.      History reviewed. No pertinent past medical history.  There are no problems to display for this patient.   No past surgical history on file.     No family history on file.  Social History   Tobacco Use  . Smoking status: Never Smoker  . Smokeless tobacco: Never Used  Substance Use Topics  . Alcohol use: Yes    Comment: occasionally  . Drug use: No    Home Medications Prior to Admission medications   Not on File    Allergies    Sulfa antibiotics  Review of Systems   Review of Systems  Constitutional: Positive for chills and fever.  HENT: Negative for congestion, rhinorrhea and sore throat.   Eyes: Negative for photophobia and visual disturbance.  Respiratory: Negative for cough and shortness of breath.   Cardiovascular: Negative for chest pain and leg swelling.  Gastrointestinal:  Negative for abdominal pain, diarrhea, nausea and vomiting.  Neurological: Positive for headaches.    Physical Exam Updated Vital Signs BP 111/67 (BP Location: Right Arm)   Pulse 88   Temp 98.6 F (37 C) (Oral)   Resp 18   Ht 5\' 10"  (1.778 m)   Wt 106.6 kg   SpO2 99%   BMI 33.72 kg/m   Physical Exam Vitals and nursing note reviewed.  Constitutional:      General: He is not in acute distress.    Appearance: He is not toxic-appearing.  HENT:     Head: Normocephalic.  Eyes:     Extraocular Movements: Extraocular movements intact.     Pupils: Pupils are equal, round, and reactive to light.     Comments: Injected conjunctivae bilaterally  Neck:     Comments: No meningismus. Cardiovascular:     Rate and Rhythm: Normal rate and regular rhythm.     Pulses: Normal pulses.     Heart sounds: Normal heart sounds. No murmur. No friction rub. No gallop.   Pulmonary:     Effort: Pulmonary effort is normal.     Breath sounds: Normal breath sounds.     Comments: Respirations equal and unlabored, patient able to speak in full sentences, lungs clear to auscultation bilaterally Abdominal:     General: Abdomen is flat. There is no distension.     Palpations: Abdomen is soft.     Tenderness: There is no abdominal tenderness. There  is no guarding or rebound.  Musculoskeletal:     Cervical back: Neck supple.     Comments: Able to move all 4 extremities without difficulty.  Negative Brudzinski's and Kernig's test.  Skin:    General: Skin is warm and dry.  Neurological:     General: No focal deficit present.     Mental Status: He is alert.     Comments: Speech is clear, able to follow commands CN III-XII intact Normal strength in upper and lower extremities bilaterally including dorsiflexion and plantar flexion, strong and equal grip strength Sensation grossly intact throughout Moves extremities without ataxia, coordination intact No pronator drift Ambulates without difficulty    Psychiatric:        Mood and Affect: Mood normal.        Behavior: Behavior normal.     ED Results / Procedures / Treatments   Labs (all labs ordered are listed, but only abnormal results are displayed) Labs Reviewed  RESPIRATORY PANEL BY RT PCR (FLU A&B, COVID)    EKG None  Radiology No results found.  Procedures Procedures (including critical care time)  Medications Ordered in ED Medications  acetaminophen (TYLENOL) tablet 650 mg (650 mg Oral Given 10/30/19 2126)    ED Course  I have reviewed the triage vital signs and the nursing notes.  Pertinent labs & imaging results that were available during my care of the patient were reviewed by me and considered in my medical decision making (see chart for details).    MDM Rules/Calculators/A&P                     34 year old male presents to the ED due to sudden onset of fever, chills, and frontal headache that started today at work.  Denies sick contacts and Covid exposures.  He has not received his Covid vaccine or flu vaccine this year.  Denies associated shortness of breath, cough, sore throat, chest pain, abdominal pain, nausea, vomiting, diarrhea.  Denies photophobia, visual changes.  Patient febrile at 66 F, but otherwise unremarkable vitals.  Patient given Tylenol for his fever here in the ED.  Physical exam unremarkable.  No meningismus to suggest meningitis.  Negative Brudzinski and Kernig's sign.  Low suspicion for meningitis at this time.  Will obtain rapid influenza/Covid test.  Rapid Covid and influenza test negative.  Suspect symptoms related to another viral infection.  Normal neurological exam.  Advised patient to return to the ER if he develops a stiff neck, neck pain, photophobia, or worsening of symptoms.  Instructed patient to take over-the-counter ibuprofen or Tylenol as needed for pain.  Advised patient to follow-up with PCP if symptoms do not improve within the next week. Strict ED precautions discussed with  patient. Patient states understanding and agrees to plan. Patient discharged home in no acute distress and stable vitals  Final Clinical Impression(s) / ED Diagnoses Final diagnoses:  Viral infection    Rx / DC Orders ED Discharge Orders    None       Jesusita Oka 10/30/19 2215    Arby Barrette, MD 11/05/19 [phone]

## 2019-10-30 NOTE — Discharge Instructions (Signed)
As discussed, your Covid and influenza test were negative.  I suspect you have another viral infection causing your fever, chills, and headache.  You may take over-the-counter ibuprofen or Tylenol as needed for your headache.  Continue to drink a lot of fluids.  Return to the ER if you develop a stiff neck, pain in the neck sensitivity to light or worsening symptoms.  Follow-up with PCP if symptoms do not improve within the next week.  Return to the ER for new or worsening symptoms.

## 2019-10-30 NOTE — ED Triage Notes (Signed)
Pt states just today while at work he started having a fever and chills with a headache.

## 2020-03-27 ENCOUNTER — Emergency Department (HOSPITAL_COMMUNITY)
Admission: EM | Admit: 2020-03-27 | Discharge: 2020-03-28 | Disposition: A | Discharge status: Home / Self Care | Payer: Self-pay | Attending: Emergency Medicine | Admitting: Emergency Medicine

## 2020-03-27 ENCOUNTER — Other Ambulatory Visit: Payer: Self-pay

## 2020-03-27 NOTE — ED Notes (Signed)
No answer

## 2020-03-27 NOTE — ED Notes (Signed)
Pt called 3x for triage, no answer. Pulled OTF

## 2020-06-02 ENCOUNTER — Encounter (HOSPITAL_COMMUNITY): Payer: Self-pay

## 2020-06-02 ENCOUNTER — Ambulatory Visit (HOSPITAL_COMMUNITY)
Admission: EM | Admit: 2020-06-02 | Discharge: 2020-06-02 | Payer: 59 | Attending: Family Medicine | Admitting: Family Medicine

## 2020-06-02 ENCOUNTER — Emergency Department (HOSPITAL_COMMUNITY)
Admission: EM | Admit: 2020-06-02 | Discharge: 2020-06-02 | Disposition: A | Discharge status: Home / Self Care | Payer: 59 | Attending: Emergency Medicine | Admitting: Emergency Medicine

## 2020-06-02 ENCOUNTER — Emergency Department (HOSPITAL_COMMUNITY): Payer: 59

## 2020-06-02 ENCOUNTER — Other Ambulatory Visit: Payer: Self-pay

## 2020-06-02 DIAGNOSIS — S1093XA Contusion of unspecified part of neck, initial encounter: Secondary | ICD-10-CM

## 2020-06-02 DIAGNOSIS — W501XXA Accidental kick by another person, initial encounter: Secondary | ICD-10-CM | POA: Insufficient documentation

## 2020-06-02 DIAGNOSIS — M542 Cervicalgia: Secondary | ICD-10-CM | POA: Insufficient documentation

## 2020-06-02 LAB — I-STAT CHEM 8, ED
BUN: 10 mg/dL (ref 6–20)
Calcium, Ion: 1.19 mmol/L (ref 1.15–1.40)
Chloride: 103 mmol/L (ref 98–111)
Creatinine, Ser: 0.8 mg/dL (ref 0.61–1.24)
Glucose, Bld: 91 mg/dL (ref 70–99)
HCT: 43 % (ref 39.0–52.0)
Hemoglobin: 14.6 g/dL (ref 13.0–17.0)
Potassium: 3.9 mmol/L (ref 3.5–5.1)
Sodium: 141 mmol/L (ref 135–145)
TCO2: 25 mmol/L (ref 22–32)

## 2020-06-02 MED ORDER — IOHEXOL 350 MG/ML SOLN
100.0000 mL | Freq: Once | INTRAVENOUS | Status: AC | PRN
  Administered 2020-06-02: 100 mL via INTRAVENOUS

## 2020-06-02 NOTE — Discharge Instructions (Addendum)
You need to go to the ER for evaluation of your neck I am afraid there may be more damage than can be evaluated in the urgent care

## 2020-06-02 NOTE — Discharge Instructions (Addendum)
You have been seen here for neck pain. I recommend taking over-the-counter pain medications like ibuprofen and/or Tylenol every 6 as needed.  Please follow dosage and on the back of bottle.  I also recommend applying heat to the area and stretching out the muscles as this will help decrease stiffness and pain.  I have given you information on exercises please follow.  Recommend follow-up with your PCP for further evaluation management.  Come back to the emergency department if you develop chest pain, shortness of breath, severe abdominal pain, uncontrolled nausea, vomiting, diarrhea.
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
## 2020-12-21 ENCOUNTER — Encounter (HOSPITAL_COMMUNITY): Payer: Self-pay | Admitting: Emergency Medicine

## 2020-12-21 ENCOUNTER — Other Ambulatory Visit: Payer: Self-pay

## 2020-12-21 ENCOUNTER — Ambulatory Visit (HOSPITAL_COMMUNITY)
Admission: EM | Admit: 2020-12-21 | Discharge: 2020-12-21 | Disposition: A | Discharge status: Home / Self Care | Payer: 59 | Attending: Family Medicine | Admitting: Family Medicine

## 2020-12-21 DIAGNOSIS — M25561 Pain in right knee: Secondary | ICD-10-CM

## 2020-12-21 MED ORDER — CYCLOBENZAPRINE HCL 10 MG PO TABS: 10.0000 mg | ORAL_TABLET | Freq: Two times a day (BID) | ORAL | 0 refills | Status: DC | PRN

## 2020-12-21 MED ORDER — PREDNISONE 20 MG PO TABS: 40.0000 mg | ORAL_TABLET | Freq: Every day | ORAL | 0 refills | Status: DC

## 2020-12-21 NOTE — ED Triage Notes (Signed)
Pain in right knee, no known injury.  Pain is sometimes in front of knee and sometimes in the back of knee

## 2020-12-21 NOTE — ED Provider Notes (Signed)
[facility]    CSN: [id number] Arrival date & time: 12/21/20  1301      History   Chief Complaint Chief Complaint  Patient presents with  . Knee Pain    HPI Terry Hurst is a 35 y.o. male.   Patient presenting today with 2-week history of right knee pain that sometimes is anterior surrounding the kneecap and other times is posterior.  Weightbearing seems to make the pain worse but does have some pain with motion even when not weightbearing.  Denies known injury, only change recently is working longer hours and constantly on feet, lifting heavy totes at work.  Has not tried anything over-the-counter for symptoms thus far.  No past history of knee injuries or chronic orthopedic issues.     History reviewed. No pertinent past medical history.  There are no problems to display for this patient.   History reviewed. No pertinent surgical history.     Home Medications    Prior to Admission medications   Medication Sig Start Date End Date Taking? Authorizing Provider  cyclobenzaprine (FLEXERIL) 10 MG tablet Take 1 tablet (10 mg total) by mouth 2 (two) times daily as needed for muscle spasms. Do not drink alcohol or drive while taking this medication.  May cause drowsiness. 12/21/20  Yes Particia Nearing, PA-C  predniSONE (DELTASONE) 20 MG tablet Take 2 tablets (40 mg total) by mouth daily with breakfast. 12/21/20  Yes Particia Nearing, PA-C    Family History Family History  Problem Relation Age of Onset  . Healthy Mother     Social History Social History   Tobacco Use  . Smoking status: Never Smoker  . Smokeless tobacco: Never Used  Vaping Use  . Vaping Use: Never used  Substance Use Topics  . Alcohol use: Yes    Comment: occasionally  . Drug use: No     Allergies   Sulfa antibiotics   Review of Systems Review of Systems Per HPI  Physical Exam Triage Vital Signs ED Triage Vitals  Enc Vitals Group     BP 12/21/20 1442 119/72      Pulse Rate 12/21/20 1442 63     Resp 12/21/20 1442 18     Temp 12/21/20 1442 99.1 F (37.3 C)     Temp Source 12/21/20 1442 Oral     SpO2 12/21/20 1442 100 %     Weight --      Height --      Head Circumference --      Peak Flow --      Pain Score 12/21/20 1439 5     Pain Loc --      Pain Edu? --      Excl. in GC? --    No data found.  Updated Vital Signs BP 119/72 (BP Location: Right Arm) Comment (BP Location): large cuff  Pulse 63   Temp 99.1 F (37.3 C) (Oral)   Resp 18   SpO2 100%   Visual Acuity Right Eye Distance:   Left Eye Distance:   Bilateral Distance:    Right Eye Near:   Left Eye Near:    Bilateral Near:     Physical Exam Vitals and nursing note reviewed.  Constitutional:      Appearance: Normal appearance.  HENT:     Head: Atraumatic.  Eyes:     Extraocular Movements: Extraocular movements intact.     Conjunctiva/sclera: Conjunctivae normal.  Cardiovascular:     Rate and Rhythm: Normal rate  and regular rhythm.  Pulmonary:     Effort: Pulmonary effort is normal.     Breath sounds: Normal breath sounds.  Musculoskeletal:        General: No swelling, tenderness, deformity or signs of injury. Normal range of motion.     Cervical back: Normal range of motion and neck supple.     Comments: Normal gait No crepitus with passive range of motion No joint laxity, negative McMurray's  Skin:    General: Skin is warm and dry.     Findings: No erythema.  Neurological:     General: No focal deficit present.     Mental Status: He is oriented to person, place, and time.     Comments: Right lower extremity neurovascularly intact  Psychiatric:        Mood and Affect: Mood normal.        Thought Content: Thought content normal.        Judgment: Judgment normal.      UC Treatments / Results  Labs (all labs ordered are listed, but only abnormal results are displayed) Labs Reviewed - No data to display  EKG   Radiology No results  found.  Procedures Procedures (including critical care time)  Medications Ordered in UC Medications - No data to display  Initial Impression / Assessment and Plan / UC Course  I have reviewed the triage vital signs and the nursing notes.  Pertinent labs & imaging results that were available during my care of the patient were reviewed by me and considered in my medical decision making (see chart for details).     Right knee exam benign appearing, suspect pain from soft tissue strain.  X-ray imaging deferred with shared decision making as no injury and no apparent bony deformities on palpation and range of motion exam.  We will try a prednisone burst, Flexeril, compression sleeve, RICE protocol.  Sports med follow-up if worsening or not resolving.  Work note given for rest.  Final Clinical Impressions(s) / UC Diagnoses   Final diagnoses:  Acute pain of right knee   Discharge Instructions   None    ED Prescriptions    Medication Sig Dispense Auth. Provider   predniSONE (DELTASONE) 20 MG tablet Take 2 tablets (40 mg total) by mouth daily with breakfast. 10 tablet Particia Nearing, PA-C   cyclobenzaprine (FLEXERIL) 10 MG tablet Take 1 tablet (10 mg total) by mouth 2 (two) times daily as needed for muscle spasms. Do not drink alcohol or drive while taking this medication.  May cause drowsiness. 10 tablet Particia Nearing, [place]     PDMP not reviewed this encounter.   Particia Nearing, [place] 12/21/20 1600

## 2021-08-09 ENCOUNTER — Encounter (HOSPITAL_COMMUNITY): Payer: Self-pay

## 2021-08-09 ENCOUNTER — Other Ambulatory Visit: Payer: Self-pay

## 2021-08-09 ENCOUNTER — Ambulatory Visit (HOSPITAL_COMMUNITY)
Admission: EM | Admit: 2021-08-09 | Discharge: 2021-08-09 | Disposition: A | Discharge status: Home / Self Care | Payer: 59 | Attending: Urgent Care | Admitting: Urgent Care

## 2021-08-09 DIAGNOSIS — M2041 Other hammer toe(s) (acquired), right foot: Secondary | ICD-10-CM | POA: Diagnosis not present

## 2021-08-09 DIAGNOSIS — L84 Corns and callosities: Secondary | ICD-10-CM | POA: Diagnosis not present

## 2021-08-09 DIAGNOSIS — M2141 Flat foot [pes planus] (acquired), right foot: Secondary | ICD-10-CM

## 2021-08-09 DIAGNOSIS — M2142 Flat foot [pes planus] (acquired), left foot: Secondary | ICD-10-CM

## 2021-08-09 DIAGNOSIS — M2042 Other hammer toe(s) (acquired), left foot: Secondary | ICD-10-CM | POA: Diagnosis not present

## 2021-08-09 MED ORDER — GORDONS UREA 40 % EX OINT: TOPICAL_OINTMENT | Freq: Every day | CUTANEOUS | 0 refills | Status: DC

## 2021-08-09 MED ORDER — HAMMER TOE GEL CUSHION PADS: 1.0000 [IU] | MEDICATED_PAD | Freq: Every day | 0 refills | Status: DC

## 2021-08-09 NOTE — ED Triage Notes (Signed)
Pt presents with left middle toe problem for few weeks that has not resolved with no known injury.

## 2021-08-09 NOTE — ED Provider Notes (Signed)
[facility]    CSN: [id number] Arrival date & time: 08/09/21  1017      History   Chief Complaint Chief Complaint  Patient presents with   Toe Pain    HPI Terry Hurst is a 36 y.o. male.   Pleasant 36 year old male presents today with concerns of pain to his left foot, medial aspect of his second digit distally.  He is uncertain how long the discomfort has been there, but notes it has bothered him significantly over the past several weeks.  He states he works on his feet all day at the same job he has done for the past 10 years and by the end of the day it is throbbing.  He wears comfortable footwear at work and feels that shoes are not too tight.  He has no underlying health conditions that he is aware of.  He denies any erythema, or drainage.  He has not tried any treatments   Toe Pain   History reviewed. No pertinent past medical history.  There are no problems to display for this patient.   History reviewed. No pertinent surgical history.     Home Medications    Prior to Admission medications   Medication Sig Start Date End Date Taking? Authorizing Provider  Foot Care Products (HAMMER TOE GEL CUSHION) PADS 1 Units by Does not apply route daily. 08/09/21  Yes Illene Sweeting L, PA  urea (GORDONS UREA) 40 % ointment Apply topically at bedtime. Apply one application to corn/ callus nightly, cover with bandage. Use until resolved, stop if adverse effects occur 08/09/21  Yes Orissa Arreaga L, PA  cyclobenzaprine (FLEXERIL) 10 MG tablet Take 1 tablet (10 mg total) by mouth 2 (two) times daily as needed for muscle spasms. Do not drink alcohol or drive while taking this medication.  May cause drowsiness. 12/21/20   Particia Nearing, PA-C    Family History Family History  Problem Relation Age of Onset   Healthy Mother     Social History Social History   Tobacco Use   Smoking status: Never   Smokeless tobacco: Never  Vaping Use   Vaping Use: Never used   Substance Use Topics   Alcohol use: Yes    Comment: occasionally   Drug use: No     Allergies   Sulfa antibiotics   Review of Systems Review of Systems  Musculoskeletal:  Positive for arthralgias (toe pain).    Physical Exam Triage Vital Signs ED Triage Vitals  Enc Vitals Group     BP 08/09/21 1127 121/79     Pulse Rate 08/09/21 1127 71     Resp 08/09/21 1127 18     Temp 08/09/21 1127 98.5 F (36.9 C)     Temp Source 08/09/21 1127 Oral     SpO2 08/09/21 1127 97 %     Weight --      Height --      Head Circumference --      Peak Flow --      Pain Score 08/09/21 1126 5     Pain Loc --      Pain Edu? --      Excl. in GC? --    No data found.  Updated Vital Signs BP 121/79 (BP Location: Left Arm)    Pulse 71    Temp 98.5 F (36.9 C) (Oral)    Resp 18    SpO2 97%   Visual Acuity Right Eye Distance:   Left Eye Distance:  Bilateral Distance:    Right Eye Near:   Left Eye Near:    Bilateral Near:     Physical Exam Vitals and nursing note reviewed.  Constitutional:      General: He is not in acute distress.    Appearance: He is well-developed.  HENT:     Head: Normocephalic and atraumatic.  Eyes:     Conjunctiva/sclera: Conjunctivae normal.  Abdominal:     Tenderness: There is abdominal tenderness.  Musculoskeletal:        General: Swelling (corn/ callus noted to bilateral medial aspects of 2nd digit distally of toes) present. No tenderness, deformity (pes planus bilaterally, hammer toes 2nd digit bilateral feet) or signs of injury. Normal range of motion.     Cervical back: Neck supple.     Right lower leg: No edema.     Left lower leg: No edema.  Skin:    General: Skin is warm and dry.     Capillary Refill: Capillary refill takes less than 2 seconds.     Findings: Lesion (callus, as above) present. No erythema.  Neurological:     Mental Status: He is alert.  Psychiatric:        Mood and Affect: Mood normal.     UC Treatments / Results   Labs (all labs ordered are listed, but only abnormal results are displayed) Labs Reviewed - No data to display  EKG   Radiology No results found.  Procedures Procedures (including critical care time)  Medications Ordered in UC Medications - No data to display  Initial Impression / Assessment and Plan / UC Course  I have reviewed the triage vital signs and the nursing notes.  Pertinent labs & imaging results that were available during my care of the patient were reviewed by me and considered in my medical decision making (see chart for details).     Corn/ callus - topical urea cream. May also try OTC salicylic acid Hammer toes - likely cause of #1. Hammer toe supports called in Pes planus - purchase over-the-counter metatarsal supports.  Follow-up with podiatry.  Final Clinical Impressions(s) / UC Diagnoses   Final diagnoses:  Corn or callus  Hammer toes of both feet  Pes planus of both feet     Discharge Instructions      Your symptoms are consistent with a corn/callus.  This is likely secondary to your hammertoe/pes planus. Please use the topical urea cream as prescribed, follow-up with your PCP for referral to podiatry. You can purchase metatarsal supports over-the-counter to put in your shoe to help with your flatfeet. Hammer toe gel cushions have been called in for you to try.    ED Prescriptions     Medication Sig Dispense Auth. Provider   Foot Care Products (HAMMER TOE GEL CUSHION) PADS 1 Units by Does not apply route daily. 2 each Verlon Setting, Mumin Denomme L, PA   urea (GORDONS UREA) 40 % ointment Apply topically at bedtime. Apply one application to corn/ callus nightly, cover with bandage. Use until resolved, stop if adverse effects occur 30 g Tala Eber L, PA      PDMP not reviewed this encounter.   Maretta Bees, [place] 08/09/21 1148

## 2021-08-09 NOTE — Discharge Instructions (Addendum)
Your symptoms are consistent with a corn/callus.  This is likely secondary to your hammertoe/pes planus. Please use the topical urea cream as prescribed, follow-up with your PCP for referral to podiatry. You can purchase metatarsal supports over-the-counter to put in your shoe to help with your flatfeet. Hammer toe gel cushions have been called in for you to try.

## 2021-10-12 ENCOUNTER — Ambulatory Visit (INDEPENDENT_AMBULATORY_CARE_PROVIDER_SITE_OTHER): Payer: 59

## 2021-10-12 ENCOUNTER — Other Ambulatory Visit: Payer: Self-pay

## 2021-10-12 ENCOUNTER — Ambulatory Visit (INDEPENDENT_AMBULATORY_CARE_PROVIDER_SITE_OTHER): Payer: 59 | Admitting: Podiatry

## 2021-10-12 DIAGNOSIS — M2142 Flat foot [pes planus] (acquired), left foot: Secondary | ICD-10-CM

## 2021-10-12 DIAGNOSIS — M2012 Hallux valgus (acquired), left foot: Secondary | ICD-10-CM | POA: Diagnosis not present

## 2021-10-12 DIAGNOSIS — L989 Disorder of the skin and subcutaneous tissue, unspecified: Secondary | ICD-10-CM

## 2021-10-12 DIAGNOSIS — M2141 Flat foot [pes planus] (acquired), right foot: Secondary | ICD-10-CM | POA: Diagnosis not present

## 2021-10-12 NOTE — Progress Notes (Signed)
? ?  Subjective:  ?36 y.o. male presenting today as a new patient for evaluation of symptomatic flatfeet.  Patient has multiple issues with his bilateral feet.  He says that he was diagnosed with flatfeet and it is causing left hip pain.  He was informed that orthotics may help alleviate some of his pain.  Currently he has not done anything for treatment ? ?Patient also states that he has developed a symptomatic corn to the medial aspect of the second digit left foot.  He went to an urgent care and was supposed to receive a lotion but did not receive any at the pharmacy.  He was referred here for follow-up treatment and evaluation by his PCP. ? ?No past medical history on file. ?No past surgical history on file. ?Allergies  ?Allergen Reactions  ? Sulfa Antibiotics Other (See Comments)  ?  Childhood   ? ? ? ?  ?Objective/Physical Exam ?General: The patient is alert and oriented x3 in no acute distress. ? ?Dermatology: Skin is warm, dry and supple bilateral lower extremities. Negative for open lesions or macerations.  Hyperkeratotic corn/callus noted to the medial aspect of the second digit secondary to rubbing against the great toe ? ?Vascular: Palpable pedal pulses bilaterally. No edema or erythema noted. Capillary refill within normal limits. ? ?Neurological: Epicritic and protective threshold grossly intact bilaterally.  ? ?Musculoskeletal Exam: Range of motion within normal limits to all pedal and ankle joints bilateral. Muscle strength 5/5 in all groups bilateral.  ?Upon weightbearing there is a medial longitudinal arch collapse bilaterally. Remove foot valgus noted to the bilateral lower extremities with excessive pronation upon mid stance.  Hallux valgus deformity also noted with an increased hallux abductus angle which is causing the left hallux to push against the adjacent digit ? ? ?Assessment: ?1. pes planus bilateral ?2.  Hallux valgus bilateral ?3.  Symptomatic corn left second digit ? ? ?Plan of Care:   ?1. Patient was evaluated.  ?2.  Excisional debridement of the hyperkeratotic corn/callus was performed using a tissue nipper without incident or bleeding.  Patient felt relief ?3.  Toe spacers provided to alleviate pressure from the great toe ?4.  Appointment with Pedorthist for custom molded orthotics ?5.  Return to clinic as needed ? ?*Works at [employer] ? ?Felecia Shelling, DPM ?[facility] ? ?Dr. Felecia Shelling, DPM  ?  ?[address]                                      ?[place], [place] [address]                ?Office [phone]  ?Fax [phone] ? ? ? ? ?

## 2021-10-26 ENCOUNTER — Ambulatory Visit (INDEPENDENT_AMBULATORY_CARE_PROVIDER_SITE_OTHER): Payer: 59

## 2021-10-26 DIAGNOSIS — M2141 Flat foot [pes planus] (acquired), right foot: Secondary | ICD-10-CM

## 2021-10-26 DIAGNOSIS — M216X2 Other acquired deformities of left foot: Secondary | ICD-10-CM

## 2021-10-26 DIAGNOSIS — M2012 Hallux valgus (acquired), left foot: Secondary | ICD-10-CM

## 2021-10-26 DIAGNOSIS — M216X1 Other acquired deformities of right foot: Secondary | ICD-10-CM

## 2021-10-26 DIAGNOSIS — M2142 Flat foot [pes planus] (acquired), left foot: Secondary | ICD-10-CM

## 2021-10-26 NOTE — Progress Notes (Signed)
SITUATION ?Reason for Consult: Evaluation for Bilateral Custom Foot Orthoses ?Patient / Caregiver Report: Patient is ready for foot orthotics ? ?OBJECTIVE DATA: ?Patient History / Diagnosis:  ?  ICD-10-CM   ?1. Pes planus of both feet  M21.41   ? M21.42   ?  ?2. Hallux valgus, left  M20.12   ?  ? ? ?Current or Previous Devices:   None and no hsitory ? ?Foot Examination: ?Skin presentation:   Intact ?Ulcers & Callousing:   None ?Toe / Foot Deformities:  Pes planus, hallux valgus ?Weight Bearing Presentation:  Planus ?Sensation:    Intact ? ?Shoe Size:    65M ? ?ORTHOTIC RECOMMENDATION ?Recommended Device: 1x pair of custom functional foot orthotics ? ?GOALS OF ORTHOSES ?- Reduce Pain ?- Prevent Foot Deformity ?- Prevent Progression of Further Foot Deformity ?- Relieve Pressure ?- Improve the Overall Biomechanical Function of the Foot and Lower Extremity. ? ?ACTIONS PERFORMED ?Potential out of pocket cost was communicated to patient. Patient understood and consent to casting. Patient was casted for Foot Orthoses via crush box. Procedure was explained and patient tolerated procedure well. Casts were shipped to central fabrication. All questions were answered and concerns addressed. ? ?PLAN ?Patient is to be called for fitting when devices are ready.  ? ? ?

## 2021-11-18 ENCOUNTER — Ambulatory Visit: Payer: 59

## 2021-11-18 DIAGNOSIS — M2012 Hallux valgus (acquired), left foot: Secondary | ICD-10-CM

## 2021-11-18 DIAGNOSIS — M2141 Flat foot [pes planus] (acquired), right foot: Secondary | ICD-10-CM

## 2021-11-18 NOTE — Progress Notes (Signed)
SITUATION: ?Reason for Visit: Fitting and Delivery of Custom Fabricated Foot Orthoses ?Patient Report: Patient reports comfort and is satisfied with device. ? ?OBJECTIVE DATA: ?Patient History / Diagnosis:   ?  ICD-10-CM   ?1. Pes planus of both feet  M21.41   ? M21.42   ?  ?2. Hallux valgus, left  M20.12   ?  ? ? ?Provided Device:  Custom Functional Foot Orthotics ?    [employer]: [id number] ? ?GOAL OF ORTHOSIS ?- Improve gait ?- Decrease energy expenditure ?- Improve Balance ?- Provide Triplanar stability of foot complex ?- Facilitate motion ? ?ACTIONS PERFORMED ?Patient was fit with foot orthotics trimmed to shoe last. Patient tolerated fittign procedure.  ? ?Patient was provided with verbal and written instruction and demonstration regarding donning, doffing, wear, care, proper fit, function, purpose, cleaning, and use of the orthosis and in all related precautions and risks and benefits regarding the orthosis. ? ?Patient was also provided with verbal instruction regarding how to report any failures or malfunctions of the orthosis and necessary follow up care. Patient was also instructed to contact our office regarding any change in status that may affect the function of the orthosis. ? ?Patient demonstrated independence with proper donning, doffing, and fit and verbalized understanding of all instructions. ? ?PLAN: ?Patient is to follow up in one week or as necessary (PRN). All questions were answered and concerns addressed. Plan of care was discussed with and agreed upon by the patient. ? ?

## 2022-01-05 ENCOUNTER — Telehealth: Payer: Self-pay | Admitting: *Deleted

## 2022-01-05 NOTE — Telephone Encounter (Signed)
Pt is scheduled on 7/12 at 215pm

## 2022-01-05 NOTE — Telephone Encounter (Signed)
Patient is having right foot pain after purchasing inserts. Please schedule.

## 2022-01-27 ENCOUNTER — Ambulatory Visit (INDEPENDENT_AMBULATORY_CARE_PROVIDER_SITE_OTHER): Payer: 59 | Admitting: Podiatry

## 2022-01-27 DIAGNOSIS — R52 Pain, unspecified: Secondary | ICD-10-CM

## 2022-01-27 DIAGNOSIS — M7671 Peroneal tendinitis, right leg: Secondary | ICD-10-CM | POA: Diagnosis not present

## 2022-01-27 MED ORDER — MELOXICAM 15 MG PO TABS: 15.0000 mg | ORAL_TABLET | Freq: Every day | ORAL | 1 refills | Status: AC

## 2022-01-27 MED ORDER — METHYLPREDNISOLONE 4 MG PO TBPK: ORAL_TABLET | ORAL | 0 refills | Status: AC

## 2022-01-27 MED ORDER — BETAMETHASONE SOD PHOS & ACET 6 (3-3) MG/ML IJ SUSP
3.0000 mg | Freq: Once | INTRAMUSCULAR | Status: AC
  Administered 2022-01-27: 3 mg via INTRA_ARTICULAR

## 2022-01-27 NOTE — Progress Notes (Signed)
   HPI: 36 y.o. male presenting today for new complaint of pain to the lateral aspect of the right foot.  Patient states that over the past few weeks he has been experiencing pain and tenderness to the lateral aspect of the foot.  He was last seen in the office on 10/12/2021.  At that time custom orthotics were dispensed.  He says that slowly over the last few months he has had increased pain and tenderness.  He does not believe it is coming from the orthotics because he states the orthotics are very comfortable.  He presents for further treatment and evaluation  No past medical history on file.  No past surgical history on file.  Allergies  Allergen Reactions   Sulfa Antibiotics Other (See Comments)    Childhood      Physical Exam: General: The patient is alert and oriented x3 in no acute distress.  Dermatology: Skin is warm, dry and supple bilateral lower extremities. Negative for open lesions or macerations.  Vascular: Palpable pedal pulses bilaterally. Capillary refill within normal limits.  Negative for any significant edema or erythema  Neurological: Light touch and protective threshold grossly intact  Musculoskeletal Exam: Today there is pain on palpation around the fifth metatarsal tubercle of the right foot where the peroneal tendon inserts consistent with insertional peroneal tendinitis.  Collapse of the medial longitudinal arch with weightbearing also noted with a rear foot valgus bilateral lower extremities  Assessment: 1.  Insertional peroneal tendinitis right   Plan of Care:  1. Patient evaluated. X-Rays reviewed.  2.  Injection of 0.5 cc Celestone Soluspan injected around the fifth metatarsal tubercle right foot 3.  Prescription for Medrol Dosepak 4.  Prescription for meloxicam 15 mg daily 5.  Continue custom molded orthotics 6.  Return to clinic 4 weeks      Felecia Shelling, DPM [facility]  Dr. Felecia Shelling, DPM    [address] [place], [place] [address]                Office [phone]  Fax [phone]

## 2022-01-29 ENCOUNTER — Telehealth: Payer: Self-pay | Admitting: Podiatry

## 2022-01-29 NOTE — Telephone Encounter (Signed)
Spoke with patient and gave instructions per Dr. Logan Bores

## 2022-01-29 NOTE — Telephone Encounter (Signed)
Take the prednisone pack first.  Then begin taking the meloxicam daily.  Thanks, Dr. Logan Bores

## 2022-01-29 NOTE — Telephone Encounter (Signed)
Patient called and stated that he has two medications-Meloxicam and prednisone. He wanted to know if he can take them together or which one should he take first. Please advise

## 2022-02-14 ENCOUNTER — Emergency Department (HOSPITAL_COMMUNITY)
Admission: EM | Admit: 2022-02-14 | Discharge: 2022-02-14 | Disposition: A | Discharge status: Home / Self Care | Payer: 59 | Attending: Emergency Medicine | Admitting: Emergency Medicine

## 2022-02-14 ENCOUNTER — Emergency Department (HOSPITAL_COMMUNITY): Payer: 59

## 2022-02-14 ENCOUNTER — Other Ambulatory Visit: Payer: Self-pay

## 2022-02-14 ENCOUNTER — Encounter (HOSPITAL_COMMUNITY): Payer: Self-pay

## 2022-02-14 DIAGNOSIS — S8992XA Unspecified injury of left lower leg, initial encounter: Secondary | ICD-10-CM | POA: Insufficient documentation

## 2022-02-14 DIAGNOSIS — Y9361 Activity, american tackle football: Secondary | ICD-10-CM | POA: Insufficient documentation

## 2022-02-14 DIAGNOSIS — W500XXA Accidental hit or strike by another person, initial encounter: Secondary | ICD-10-CM | POA: Insufficient documentation

## 2022-02-14 DIAGNOSIS — M25462 Effusion, left knee: Secondary | ICD-10-CM | POA: Insufficient documentation

## 2022-02-14 NOTE — Discharge Instructions (Addendum)
Please read and follow all provided instructions.  Your diagnoses today include:  1. Injury of left knee, initial encounter   2. Effusion of left knee     Tests performed today include: An x-ray of the affected area - does NOT show any broken bones Vital signs. See below for your results today.   Medications prescribed:  Continue meloxicam or over-the-counter naproxen/ibuprofen  Take any prescribed medications only as directed.  Home care instructions:  Follow any educational materials contained in this packet Follow R.I.C.E. Protocol: R - rest your injury  I  - use ice on injury without applying directly to skin C - compress injury with bandage or splint E - elevate the injury as much as possible  Follow-up instructions: Please follow-up with your primary care provider or the provided orthopedic physician (bone specialist) if you continue to have significant pain in 1 week. In this case you may have a more severe injury that requires further care.   Return instructions:  Please return if your toes or feet are numb or tingling, appear gray or blue, or you have severe pain (also elevate the leg and loosen splint or wrap if you were given one) Please return to the Emergency Department if you experience worsening symptoms.  Please return if you have any other emergent concerns.  Additional Information:  Your vital signs today were: BP 135/84 (BP Location: Right Arm)   Pulse 73   Temp 98.5 F (36.9 C) (Oral)   Resp 16   SpO2 98%  If your blood pressure (BP) was elevated above 135/85 this visit, please have this repeated by your doctor within one month. --------------

## 2022-02-14 NOTE — Progress Notes (Signed)
Orthopedic Tech Progress Note Patient Details:  Terry Hurst 1986-05-05 [id number]  Ortho Devices Type of Ortho Device: Crutches, Knee Immobilizer Ortho Device/Splint Location: LLE Ortho Device/Splint Interventions: Ordered, Application, Adjustment   Post Interventions Patient Tolerated: Well Instructions Provided: Care of device  [name] 02/14/2022, 12:11 PM

## 2022-02-14 NOTE — ED Provider Notes (Signed)
[facility] EMERGENCY DEPARTMENT Provider Note   CSN: [id number] Arrival date & time: 02/14/22  1001     History  No chief complaint on file.   Terry Hurst is a 36 y.o. male.  Patient presents emergency department for evaluation of left knee pain and swelling starting acutely yesterday.  Patient was playing football and was tackled by another player.  He states that his left leg was planted.  He felt the knee buckle.  He had difficulty walking after the accident.  Today he is able to bear some weight but has significant pain especially on the outside of the ankle.  No treatments prior to arrival.  No ankle or hip pain on the affected side.       Home Medications Prior to Admission medications   Medication Sig Start Date End Date Taking? Authorizing Provider  meloxicam (MOBIC) 15 MG tablet Take 1 tablet (15 mg total) by mouth daily. 01/27/22   Felecia Shelling, DPM  methylPREDNISolone (MEDROL DOSEPAK) 4 MG TBPK tablet 6 day dose pack - take as directed 01/27/22   Felecia Shelling, DPM      Allergies    Sulfa antibiotics    Review of Systems   Review of Systems  Physical Exam Updated Vital Signs BP 135/84 (BP Location: Right Arm)   Pulse 73   Temp 98.5 F (36.9 C) (Oral)   Resp 16   SpO2 98%  Physical Exam Vitals and nursing note reviewed.  Constitutional:      Appearance: He is well-developed.  HENT:     Head: Normocephalic and atraumatic.  Eyes:     Conjunctiva/sclera: Conjunctivae normal.  Cardiovascular:     Pulses: Normal pulses. No decreased pulses.  Musculoskeletal:        General: Tenderness present.     Cervical back: Normal range of motion and neck supple.     Left knee: Effusion present. Decreased range of motion. Tenderness present over the lateral joint line.     Right lower leg: No edema.     Left lower leg: No edema.  Skin:    General: Skin is warm and dry.  Neurological:     Mental Status: He is alert.     Sensory: No sensory  deficit.     Comments: Motor, sensation, and vascular distal to the injury is fully intact.   Psychiatric:        Mood and Affect: Mood normal.     ED Results / Procedures / Treatments   Labs (all labs ordered are listed, but only abnormal results are displayed) Labs Reviewed - No data to display  EKG None  Radiology DG Knee Complete 4 Views Left  Result Date: 02/14/2022 CLINICAL DATA:  36 year old male presenting with injury to the LEFT knee. EXAM: LEFT KNEE - COMPLETE 4+ VIEW COMPARISON:  None available FINDINGS: Mild soft tissue swelling suggested over anterior knee. No sign of joint effusion. Mild irregularity of the tibial tuberosity likely from remote injury or prior Osgood-Schlatter's. No sign of fracture or dislocation. IMPRESSION: No acute fracture or dislocation. Mild soft tissue swelling over the anterior knee. Query Osgood-Schlatter's or sequela of prior injury over the tibial tuberosity. Electronically Signed   By: Donzetta Kohut M.D.   On: 02/14/2022 10:53    Procedures Procedures    Medications Ordered in ED Medications - No data to display  ED Course/ Medical Decision Making/ A&P    Patient seen and examined. History obtained directly from patient. Work-up  including labs, imaging, EKG ordered in triage, if performed, were reviewed.    Labs/EKG: None ordered  Imaging: Independently reviewed and interpreted.  This included: X-ray of the knee, agree negative.  Medications/Fluids: None ordered  Most recent vital signs reviewed and are as follows: BP 135/84 (BP Location: Right Arm)   Pulse 73   Temp 98.5 F (36.9 C) (Oral)   Resp 16   SpO2 98%   Initial impression: Knee injury, effusion  Home treatment plan: RICE protocol, NSAIDs, knee immobilizer/crutches  Return instructions discussed with patient: Worsening symptoms or other concerns  Follow-up instructions discussed with patient: Follow-up with orthopedic doctor in 5 days.                           Medical Decision Making Amount and/or Complexity of Data Reviewed Radiology: ordered.   Patient with knee injury.  He is able to flex and extend somewhat against gravity.  Low concern for quadriceps tendon or patellar tendon tear.  Lower extremity is neurovascularly intact.  No signs of compartment syndrome.  Low concern for significant vascular injury.  No dislocation or knee fracture.  Concern for ligamentous injury.        Final Clinical Impression(s) / ED Diagnoses Final diagnoses:  Injury of left knee, initial encounter  Effusion of left knee    Rx / DC Orders ED Discharge Orders     None         Renne Crigler, PA-C 02/14/22 1158    Curatolo, Adam, DO 02/14/22 1328

## 2022-02-14 NOTE — ED Triage Notes (Signed)
Patient had football injury yesterday, now pain with ambulation and worse with any rotation.

## 2022-02-24 ENCOUNTER — Ambulatory Visit: Payer: 59 | Admitting: Podiatry

## 2022-05-15 ENCOUNTER — Ambulatory Visit (HOSPITAL_COMMUNITY)
Admission: EM | Admit: 2022-05-15 | Discharge: 2022-05-15 | Disposition: A | Discharge status: Home / Self Care | Payer: 59 | Attending: Internal Medicine | Admitting: Internal Medicine

## 2022-05-15 ENCOUNTER — Ambulatory Visit (INDEPENDENT_AMBULATORY_CARE_PROVIDER_SITE_OTHER): Payer: 59

## 2022-05-15 ENCOUNTER — Encounter (HOSPITAL_COMMUNITY): Payer: Self-pay

## 2022-05-15 DIAGNOSIS — M79641 Pain in right hand: Secondary | ICD-10-CM | POA: Diagnosis not present

## 2022-05-15 DIAGNOSIS — S63601A Unspecified sprain of right thumb, initial encounter: Secondary | ICD-10-CM | POA: Diagnosis not present

## 2022-05-15 NOTE — ED Provider Notes (Signed)
[facility]    CSN: [id number] Arrival date & time: 05/15/22  1204      History   Chief Complaint Chief Complaint  Patient presents with   Finger Injury    HPI Terry Hurst is a 36 y.o. male presents to urgent care today with pain to right thumb.  Patient states he was throwing football yesterday with his son when he jammed thumb.  Patient denies any swelling, bruising, numbness or tingling.  Took Motrin with relief of pain.   History reviewed. No pertinent past medical history.  There are no problems to display for this patient.   History reviewed. No pertinent surgical history.     Home Medications    Prior to Admission medications   Medication Sig Start Date End Date Taking? Authorizing Provider  meloxicam (MOBIC) 15 MG tablet Take 1 tablet (15 mg total) by mouth daily. 01/27/22   Terry Hurst, DPM  methylPREDNISolone (MEDROL DOSEPAK) 4 MG TBPK tablet 6 day dose pack - take as directed 01/27/22   Terry Hurst, DPM    Family History Family History  Problem Relation Age of Onset   Healthy Mother     Social History Social History   Tobacco Use   Smoking status: Never   Smokeless tobacco: Never  Vaping Use   Vaping Use: Never used  Substance Use Topics   Alcohol use: Yes    Comment: occasionally   Drug use: No     Allergies   Sulfa antibiotics   Review of Systems As stated in HPI otherwise negative   Physical Exam Triage Vital Signs ED Triage Vitals [05/15/22 1316]  Enc Vitals Group     BP      Pulse Rate 92     Resp 16     Temp 97.9 F (36.6 C)     Temp Source Oral     SpO2 99 %     Weight      Height      Head Circumference      Peak Flow      Pain Score      Pain Loc      Pain Edu?      Excl. in GC?    No data found.  Updated Vital Signs Pulse 92   Temp 97.9 F (36.6 C) (Oral)   Resp 16   SpO2 99%   Visual Acuity Right Eye Distance:   Left Eye Distance:   Bilateral Distance:    Right Eye Near:   Left  Eye Near:    Bilateral Near:     Physical Exam Constitutional:      General: He is not in acute distress.    Appearance: Normal appearance. He is not ill-appearing or toxic-appearing.  Musculoskeletal:        General: Tenderness present. No swelling or deformity. Normal range of motion.     Comments: TTP upon palpation at base of right thumb.  Tenderness seems to be more medial.  No swelling, ecchymosis or erythema.  Good grasp motion between thumb and index finger.  No thumb laxity  Skin:    General: Skin is warm and dry.     Capillary Refill: Capillary refill takes less than 2 seconds.     Findings: No bruising or erythema.  Neurological:     General: No focal deficit present.     Mental Status: He is alert and oriented to person, place, and time.     Motor: No weakness.  UC Treatments / Results  Labs (all labs ordered are listed, but only abnormal results are displayed) Labs Reviewed - No data to display  EKG   Radiology DG Hand Complete Right  Result Date: 05/15/2022 CLINICAL DATA:  Pain right thumb for several days EXAM: RIGHT HAND - COMPLETE 3+ VIEW COMPARISON:  None Available. FINDINGS: No fracture or dislocation is seen. No significant degenerative changes are noted. There are no abnormal soft tissue calcifications. IMPRESSION: No radiographic abnormalities are seen in right hand. Electronically Signed   By: Terry Hurst M.D.   On: 05/15/2022 14:33    Procedures Procedures (including critical care time)  Medications Ordered in UC Medications - No data to display  Initial Impression / Assessment and Plan / UC Course  I have reviewed the triage vital signs and the nursing notes.  Pertinent labs & imaging results that were available during my care of the patient were reviewed by me and considered in my medical decision making (see chart for details).  Thumb sprain, right -X-ray negative for fracture.  No evidence of UCL tear on exam -Ice, rest, Motrin  as needed -Follow-up orthopedics for any persistent or worsening symptoms  Reviewed expections re: course of current medical issues. Questions answered. Outlined signs and symptoms indicating need for more acute intervention. Pt verbalized understanding. AVS given   Final Clinical Impressions(s) / UC Diagnoses   Final diagnoses:  Sprain of right thumb, unspecified site of digit, initial encounter     Discharge Instructions      Your x-ray today does not show any evidence of fracture.  Based on your exam, you likely have a sprain of the ligament in your thumb.  I do not feel like this ligament is torn.  Rest, ice several times daily for 15 to 20 minutes at a time, ibuprofen as needed for pain.  If pain persist or worsens I have given you the number for orthopedics for follow-up.     ED Prescriptions   None    PDMP not reviewed this encounter.   Terry Sevin, NP 05/15/22 1452

## 2022-05-15 NOTE — ED Triage Notes (Signed)
Pt reports right thumb pain for several days.  Pt is taking motrin for pain.

## 2022-05-15 NOTE — Discharge Instructions (Signed)
Your x-ray today does not show any evidence of fracture.  Based on your exam, you likely have a sprain of the ligament in your thumb.  I do not feel like this ligament is torn.  Rest, ice several times daily for 15 to 20 minutes at a time, ibuprofen as needed for pain.  If pain persist or worsens I have given you the number for orthopedics for follow-up.

## 2023-01-03 ENCOUNTER — Other Ambulatory Visit: Payer: Self-pay | Admitting: Internal Medicine

## 2023-01-03 ENCOUNTER — Emergency Department (HOSPITAL_COMMUNITY)
Admission: EM | Admit: 2023-01-03 | Discharge: 2023-01-03 | Disposition: A | Discharge status: Home / Self Care | Payer: Managed Care, Other (non HMO) | Attending: Emergency Medicine | Admitting: Emergency Medicine

## 2023-01-03 ENCOUNTER — Encounter (HOSPITAL_COMMUNITY): Payer: Self-pay

## 2023-01-03 ENCOUNTER — Other Ambulatory Visit: Payer: Self-pay

## 2023-01-03 DIAGNOSIS — M545 Low back pain, unspecified: Secondary | ICD-10-CM | POA: Insufficient documentation

## 2023-01-03 MED ORDER — OXYCODONE-ACETAMINOPHEN 5-325 MG PO TABS: 1.0000 | ORAL_TABLET | Freq: Four times a day (QID) | ORAL | 0 refills | Status: AC | PRN

## 2023-01-03 MED ORDER — HYDROMORPHONE HCL 1 MG/ML IJ SOLN: 1.0000 mg | Freq: Once | INTRAMUSCULAR | Status: DC

## 2023-01-03 MED ORDER — KETOROLAC TROMETHAMINE 15 MG/ML IJ SOLN
15.0000 mg | Freq: Once | INTRAMUSCULAR | Status: AC
  Administered 2023-01-03: 15 mg via INTRAMUSCULAR
  Filled 2023-01-03: qty 1

## 2023-01-03 MED ORDER — LIDOCAINE 4 % EX PTCH: 1.0000 | MEDICATED_PATCH | CUTANEOUS | 0 refills | Status: AC

## 2023-01-03 MED ORDER — CYCLOBENZAPRINE HCL 10 MG PO TABS: 10.0000 mg | ORAL_TABLET | Freq: Two times a day (BID) | ORAL | 0 refills | Status: DC | PRN

## 2023-01-03 MED ORDER — OXYCODONE-ACETAMINOPHEN 5-325 MG PO TABS
1.0000 | ORAL_TABLET | Freq: Once | ORAL | Status: AC
  Administered 2023-01-03: 1 via ORAL
  Filled 2023-01-03: qty 1

## 2023-01-03 MED ORDER — LIDOCAINE 5 % EX PTCH
1.0000 | MEDICATED_PATCH | CUTANEOUS | Status: DC
  Administered 2023-01-03: 1 via TRANSDERMAL
  Filled 2023-01-03: qty 1

## 2023-01-03 NOTE — ED Triage Notes (Signed)
Pt came ion POV staing he has 10/10 back pain. He was playing catch yesterday with his son and he had a sharp pain in his back.

## 2023-01-03 NOTE — ED Provider Notes (Signed)
[place] EMERGENCY DEPARTMENT AT [facility] Provider Note   CSN: [id number] Arrival date & time: 01/03/23  6578     History  Chief Complaint  Patient presents with   Back Pain    Terry Hurst is a 37 y.o. male otherwise healthy presents today for evaluation of back pain.  Patient reports he has had lower back pain since yesterday.  He was playing with his son when this came on.  The pain is located in his lower back, radiating to his right leg.  Got worse with changing position and walking.  He denies any fever, urinary symptoms, urinary retention, bowel incontinence, saddle anesthesia, distal weakness, IVDU.  Patient took 800 mg ibuprofen at home yesterday with minimal improvement.  He denies any recent fall or direct injury to his back.   Back Pain    History reviewed. No pertinent past medical history. History reviewed. No pertinent surgical history.   Home Medications Prior to Admission medications   Medication Sig Start Date End Date Taking? Authorizing Provider  cyclobenzaprine (FLEXERIL) 10 MG tablet Take 1 tablet (10 mg total) by mouth 2 (two) times daily as needed for muscle spasms. 01/03/23  Yes Jeanelle Malling, PA  lidocaine (HM LIDOCAINE PATCH) 4 % Place 1 patch onto the skin daily. 01/03/23  Yes Jeanelle Malling, PA  oxyCODONE-acetaminophen (PERCOCET/ROXICET) 5-325 MG tablet Take 1 tablet by mouth every 6 (six) hours as needed for severe pain. 01/03/23  Yes Jeanelle Malling, PA  meloxicam (MOBIC) 15 MG tablet Take 1 tablet (15 mg total) by mouth daily. 01/27/22   Felecia Shelling, DPM  methylPREDNISolone (MEDROL DOSEPAK) 4 MG TBPK tablet 6 day dose pack - take as directed 01/27/22   Felecia Shelling, DPM      Allergies    Sulfa antibiotics    Review of Systems   Review of Systems  Musculoskeletal:  Positive for back pain.    Physical Exam Updated Vital Signs BP 110/72 (BP Location: Right Arm)   Pulse 61   Temp 98.4 F (36.9 C) (Oral)   Resp 18   SpO2 99%  Physical  Exam Vitals and nursing note reviewed.  Constitutional:      Appearance: Normal appearance.  HENT:     Head: Normocephalic and atraumatic.     Mouth/Throat:     Mouth: Mucous membranes are moist.  Eyes:     General: No scleral icterus. Cardiovascular:     Rate and Rhythm: Normal rate and regular rhythm.     Pulses: Normal pulses.     Heart sounds: Normal heart sounds.  Pulmonary:     Effort: Pulmonary effort is normal.     Breath sounds: Normal breath sounds.  Abdominal:     General: Abdomen is flat.     Palpations: Abdomen is soft.     Tenderness: There is no abdominal tenderness.  Musculoskeletal:        General: No deformity.     Comments: Tenderness to palpation to paraspinal muscles of the lumbar spine.  Positive right straight leg raise.  Skin:    General: Skin is warm.     Findings: No rash.  Neurological:     General: No focal deficit present.     Mental Status: He is alert.  Psychiatric:        Mood and Affect: Mood normal.     ED Results / Procedures / Treatments   Labs (all labs ordered are listed, but only abnormal results are displayed) Labs  Reviewed - No data to display  EKG None  Radiology No results found.  Procedures Procedures    Medications Ordered in ED Medications  oxyCODONE-acetaminophen (PERCOCET/ROXICET) 5-325 MG per tablet 1 tablet (1 tablet Oral Given 01/03/23 0724)  ketorolac (TORADOL) 15 MG/ML injection 15 mg (15 mg Intramuscular Given 01/03/23 4098)    ED Course/ Medical Decision Making/ A&P                             Medical Decision Making Risk OTC drugs. Prescription drug management.   This patient presents to the ED for back pain, this involves an extensive number of treatment options, and is a complaint that carries with a high risk of complications and morbidity.  The differential diagnosis includes Acute hepatobiliary disease, pancreatitis, appendicitis, PUD, gastritis, SBO, diverticulitis, colitis, viral  gastroenteritis, Crohn's, UC, vascular catastrophe, UTI, pyelonephritis, renal stone, obstructed stone, infected stone, testicular torsion, epididymitis, incarcerated hernia, STD, etc.  This is not an exhaustive list.  Problem list/ ED course/ Critical interventions/ Medical management: HPI: See above Vital signs within normal range and stable throughout visit. Laboratory/imaging studies significant for: See above. On physical examination, patient is afebrile and appears in no acute distress.  There was tenderness to palpation to paraspinal muscle of the lumbar spine with positive right straight leg raise.  Symptom most consistent with sciatica versus musculoskeletal spasm/strain.  No back pain red flags on history and physical.  Presentation not consistent with malignancy-lack of history of malignancy, lack of B symptoms, fracture-no trauma no bony tenderness to palpation.  Unlikely cauda equina, patient has no bowel or urinary incontinence/retention, no saddle anesthesia, no distal weakness.  Unlikely epidural abscess, patient is not an IVDU, no fever.  Unlikely pyelonephritis, patient is afebrile, he has no CVA tenderness or urinary symptoms.  Given clinical picture, no indication for imaging at this time.  Given lidocaine patch, oxycodone and Toradol for pain.  Reevaluation patient of this medication show the patient improved.  I will send an Rx of muscle relaxants and lidocaine patch.  Advised patient to take Tylenol/ibuprofen, muscle relaxants for symptom relief, follow-up with his primary care physician for reevaluation.  Strict return cautions discussed. I have reviewed the patient home medicines and have made adjustments as needed.  Cardiac monitoring/EKG: The patient was maintained on a cardiac monitor.  I personally reviewed and interpreted the cardiac monitor which showed an underlying rhythm of: sinus rhythm.  Additional history obtained: External records from outside source obtained and  reviewed including: Chart review including previous notes, labs, imaging.  Consultations obtained:  Disposition Continued outpatient therapy. Follow-up with PCP recommended for reevaluation of symptoms. Treatment plan discussed with patient.  Pt acknowledged understanding was agreeable to the plan. Worrisome signs and symptoms were discussed with patient, and patient acknowledged understanding to return to the ED if they noticed these signs and symptoms. Patient was stable upon discharge.   This chart was dictated using voice recognition software.  Despite best efforts to proofread,  errors can occur which can change the documentation meaning.          Final Clinical Impression(s) / ED Diagnoses Final diagnoses:  Acute low back pain, unspecified back pain laterality, unspecified whether sciatica present    Rx / DC Orders ED Discharge Orders          Ordered    cyclobenzaprine (FLEXERIL) 10 MG tablet  2 times daily PRN  01/03/23 0849    lidocaine (HM LIDOCAINE PATCH) 4 %  Every 24 hours        01/03/23 0849    oxyCODONE-acetaminophen (PERCOCET/ROXICET) 5-325 MG tablet  Every 6 hours PRN        01/03/23 0849              Jeanelle Malling, PA 01/05/23 0981    Arby Barrette, MD 01/07/23 1513

## 2023-01-03 NOTE — Discharge Instructions (Signed)
Please take your medications as prescribed. Take tylenol/ibuprofen every 6 hours or Percocet as needed for pain. I recommend close follow-up with PCP for reevaluation.  Please do not hesitate to return to emergency department if worrisome signs symptoms we discussed become apparent.  

## 2023-01-04 LAB — CBC
HCT: 41.9 % (ref 38.5–50.0)
Hemoglobin: 13.2 g/dL (ref 13.2–17.1)
MCH: 27.6 pg (ref 27.0–33.0)
MCHC: 31.5 g/dL — ABNORMAL LOW (ref 32.0–36.0)
MCV: 87.7 fL (ref 80.0–100.0)
MPV: 10.6 fL (ref 7.5–12.5)
Platelets: 305 10*3/uL (ref 140–400)
RBC: 4.78 10*6/uL (ref 4.20–5.80)
RDW: 13.6 % (ref 11.0–15.0)
WBC: 8.6 10*3/uL (ref 3.8–10.8)

## 2023-01-04 LAB — COMPLETE METABOLIC PANEL WITH GFR
AG Ratio: 1.5 (calc) (ref 1.0–2.5)
ALT: 12 U/L (ref 9–46)
AST: 13 U/L (ref 10–40)
Albumin: 4.3 g/dL (ref 3.6–5.1)
Alkaline phosphatase (APISO): 52 U/L (ref 36–130)
BUN: 21 mg/dL (ref 7–25)
CO2: 22 mmol/L (ref 20–32)
Calcium: 9.3 mg/dL (ref 8.6–10.3)
Chloride: 105 mmol/L (ref 98–110)
Creat: 0.95 mg/dL (ref 0.60–1.26)
Globulin: 2.8 g/dL (calc) (ref 1.9–3.7)
Glucose, Bld: 96 mg/dL (ref 65–99)
Potassium: 4.2 mmol/L (ref 3.5–5.3)
Sodium: 139 mmol/L (ref 135–146)
Total Bilirubin: 0.3 mg/dL (ref 0.2–1.2)
Total Protein: 7.1 g/dL (ref 6.1–8.1)
eGFR: 107 mL/min/{1.73_m2} (ref >=60)

## 2023-01-04 LAB — LIPID PANEL
Cholesterol: 173 mg/dL (ref <200)
HDL: 57 mg/dL (ref >=40)
LDL Cholesterol (Calc): 95 mg/dL (calc)
Non-HDL Cholesterol (Calc): 116 mg/dL (calc) (ref <130)
Total CHOL/HDL Ratio: 3 (calc) (ref <5.0)
Triglycerides: 112 mg/dL (ref <150)

## 2023-01-04 LAB — TSH: TSH: 0.88 mIU/L (ref 0.40–4.50)

## 2023-01-04 LAB — VITAMIN D 25 HYDROXY (VIT D DEFICIENCY, FRACTURES): Vit D, 25-Hydroxy: 18 ng/mL — ABNORMAL LOW (ref 30–100)

## 2023-01-30 ENCOUNTER — Other Ambulatory Visit: Payer: Self-pay

## 2023-01-30 ENCOUNTER — Emergency Department (HOSPITAL_COMMUNITY)
Admission: EM | Admit: 2023-01-30 | Discharge: 2023-01-30 | Disposition: A | Discharge status: Home / Self Care | Payer: Managed Care, Other (non HMO) | Attending: Student | Admitting: Student

## 2023-01-30 DIAGNOSIS — M5441 Lumbago with sciatica, right side: Secondary | ICD-10-CM | POA: Diagnosis not present

## 2023-01-30 DIAGNOSIS — M545 Low back pain, unspecified: Secondary | ICD-10-CM | POA: Diagnosis present

## 2023-01-30 MED ORDER — KETOROLAC TROMETHAMINE 30 MG/ML IJ SOLN
30.0000 mg | Freq: Once | INTRAMUSCULAR | Status: AC
  Administered 2023-01-30: 30 mg via INTRAMUSCULAR
  Filled 2023-01-30: qty 1

## 2023-01-30 MED ORDER — CYCLOBENZAPRINE HCL 5 MG PO TABS: 5.0000 mg | ORAL_TABLET | Freq: Three times a day (TID) | ORAL | 0 refills | Status: AC | PRN

## 2023-01-30 MED ORDER — KETOROLAC TROMETHAMINE 10 MG PO TABS: 10.0000 mg | ORAL_TABLET | Freq: Four times a day (QID) | ORAL | 0 refills | Status: AC | PRN

## 2023-01-30 NOTE — Discharge Instructions (Addendum)
I believe that you likely has sciatica, this will likely need physical therapy, for rehab as it has been ongoing for about a month.  You should can use the Toradol for the pain control as well as Tylenol at home.  I have also prescribed a muscle relaxer, do not use this when operating heavy machinery as it will cause increased fatigue.  Return to the ER if you have any loss of bowel, bladder, development of wounds or swelling around your anus or buttocks.

## 2023-01-30 NOTE — ED Provider Notes (Signed)
[place] EM[facility]GENCY DEPARTMENT AT [facility] Provider Note   CSN: [id number] Arrival date & time: 01/30/23  0941     History  Chief Complaint  Patient presents with   Back Pain    Terry Hurst is a 37 y.o. male, pertinent past medical history, who presents to the ED secondary to persistent back pain, has been going on since June.  He states that he was playing with his son, in June, and also had some right back pain radiating down his leg.  He states has been persistent since then, and is just tired of it.  He denies any, loss of bowel, bladder, development of wounds on his buttocks, or pain around his anus.  No history of gluteal abscesses, or pilonidal cyst.  He notes that when he changes positions, moves, or drives it very is very much aggravating.  Pain is burning and radiates down his right leg.     Home Medications Prior to Admission medications   Medication Sig Start Date End Date Taking? Authorizing Provider  cyclobenzaprine (FLEXERIL) 5 MG tablet Take 1 tablet (5 mg total) by mouth 3 (three) times daily as needed for muscle spasms. 01/30/23  Yes Tykeem Lanzer L, PA  ketorolac (TORADOL) 10 MG tablet Take 1 tablet (10 mg total) by mouth every 6 (six) hours as needed. 01/30/23  Yes Fadil Macmaster L, PA  lidocaine (HM LIDOCAINE PATCH) 4 % Place 1 patch onto the skin daily. 01/03/23   Jeanelle Malling, PA  meloxicam (MOBIC) 15 MG tablet Take 1 tablet (15 mg total) by mouth daily. 01/27/22   Felecia Shelling, DPM  methylPREDNISolone (MEDROL DOSEPAK) 4 MG TBPK tablet 6 day dose pack - take as directed 01/27/22   Felecia Shelling, DPM  oxyCODONE-acetaminophen (PERCOCET/ROXICET) 5-325 MG tablet Take 1 tablet by mouth every 6 (six) hours as needed for severe pain. 01/03/23   Jeanelle Malling, PA      Allergies    Sulfa antibiotics    Review of Systems   Review of Systems  Constitutional:  Negative for fever.  Musculoskeletal:  Positive for back pain.    Physical Exam Updated Vital Signs BP  (!) 140/83 (BP Location: Left Arm)   Pulse 66   Temp 98.1 F (36.7 C) (Oral)   Resp 16   Ht 5\' 10"  (1.778 m)   Wt 132 kg   SpO2 99%   BMI 41.75 kg/m  Physical Exam Constitutional:      General: He is not in acute distress.    Appearance: He is well-developed. He is not toxic-appearing.  HENT:     Head: Normocephalic and atraumatic.  Abdominal:     General: There is no distension.     Palpations: Abdomen is soft.     Tenderness: There is no abdominal tenderness.  Musculoskeletal:     Cervical back: Normal range of motion and neck supple. No spinous process tenderness or muscular tenderness.     Comments: No obvious deformity, appreciable swelling, erythema, ecchymosis, significant open wounds, or increased warmth.  Back: No point/focal vertebral tenderness, no palpable step off or crepitus.  Tenderness to palpation of right glute.  Lower extremities: ranging @ all major joints. No focal bony tenderness.   Skin:    General: Skin is warm and dry.     Findings: No rash.     Comments: No rashes, wounds, induration noted on gluteal cleft, or glutes.  Neurological:     Mental Status: He is alert.  Comments: Sensation grossly intact to bilateral lower extremities. 5/5 symmetric strength with plantar/dorsiflexion bilaterally. Gait is intact without obvious foot drop.  Positive straight leg raise of the right lower extremity.     ED Results / Procedures / Treatments   Labs (all labs ordered are listed, but only abnormal results are displayed) Labs Reviewed - No data to display  EKG None  Radiology No results found.  Procedures Procedures   Medications Ordered in ED Medications  ketorolac (TORADOL) 30 MG/ML injection 30 mg (has no administration in time range)    ED Course/ Medical Decision Making/ A&P                             Medical Decision Making Patient is a 37 year old male, here for right buttocks pain rating down to his right foot, has been going on for  the last month.  He states it is worse when he moves, he has a good pulse, positive straight leg raise, no evidence of rash, or wound or induration on his buttocks, we discussed taking Toradol for this, and following up with primary care doctor for physical therapy as a believe that he suffers from sciatica.  He voiced understanding and was discharged home.  He had no red flag symptoms, to indicate imaging.  Risk Prescription drug management.     Final Clinical Impression(s) / ED Diagnoses Final diagnoses:  Acute right-sided low back pain with right-sided sciatica    Rx / DC Orders ED Discharge Orders          Ordered    ketorolac (TORADOL) 10 MG tablet  Every 6 hours PRN        01/30/23 1016    cyclobenzaprine (FLEXERIL) 5 MG tablet  3 times daily PRN        01/30/23 1016              Terrance Usery, [place], [place] 01/30/23 1019    Glendora Score, MD 01/31/23 1114

## 2023-01-30 NOTE — ED Triage Notes (Signed)
Pt reports worsening of lower back pain x1 month with radiation down his right leg. Denies injury. Pain worsening since last visit in June.
